# Patient Record
Sex: Female | Born: 1945 | Hispanic: No | Marital: Married
Health system: Southern US, Community
[De-identification: ages and names within clinical notes are randomized; demographics above are authoritative.]

## PROBLEM LIST (undated history)

## (undated) DIAGNOSIS — I10 Essential (primary) hypertension: Secondary | ICD-10-CM

## (undated) DIAGNOSIS — G4730 Sleep apnea, unspecified: Secondary | ICD-10-CM

---

## 2011-11-24 DIAGNOSIS — R262 Difficulty in walking, not elsewhere classified: Secondary | ICD-10-CM | POA: Diagnosis not present

## 2011-11-26 DIAGNOSIS — R262 Difficulty in walking, not elsewhere classified: Secondary | ICD-10-CM | POA: Diagnosis not present

## 2011-11-30 DIAGNOSIS — R262 Difficulty in walking, not elsewhere classified: Secondary | ICD-10-CM | POA: Diagnosis not present

## 2011-12-07 DIAGNOSIS — M25569 Pain in unspecified knee: Secondary | ICD-10-CM | POA: Diagnosis not present

## 2011-12-08 DIAGNOSIS — R262 Difficulty in walking, not elsewhere classified: Secondary | ICD-10-CM | POA: Diagnosis not present

## 2011-12-09 DIAGNOSIS — R262 Difficulty in walking, not elsewhere classified: Secondary | ICD-10-CM | POA: Diagnosis not present

## 2011-12-14 DIAGNOSIS — R262 Difficulty in walking, not elsewhere classified: Secondary | ICD-10-CM | POA: Diagnosis not present

## 2011-12-15 DIAGNOSIS — R262 Difficulty in walking, not elsewhere classified: Secondary | ICD-10-CM | POA: Diagnosis not present

## 2011-12-21 DIAGNOSIS — R262 Difficulty in walking, not elsewhere classified: Secondary | ICD-10-CM | POA: Diagnosis not present

## 2011-12-22 DIAGNOSIS — R262 Difficulty in walking, not elsewhere classified: Secondary | ICD-10-CM | POA: Diagnosis not present

## 2011-12-24 DIAGNOSIS — R262 Difficulty in walking, not elsewhere classified: Secondary | ICD-10-CM | POA: Diagnosis not present

## 2011-12-28 DIAGNOSIS — R262 Difficulty in walking, not elsewhere classified: Secondary | ICD-10-CM | POA: Diagnosis not present

## 2011-12-31 DIAGNOSIS — R262 Difficulty in walking, not elsewhere classified: Secondary | ICD-10-CM | POA: Diagnosis not present

## 2012-01-04 DIAGNOSIS — R262 Difficulty in walking, not elsewhere classified: Secondary | ICD-10-CM | POA: Diagnosis not present

## 2012-01-05 DIAGNOSIS — R262 Difficulty in walking, not elsewhere classified: Secondary | ICD-10-CM | POA: Diagnosis not present

## 2012-01-18 DIAGNOSIS — R262 Difficulty in walking, not elsewhere classified: Secondary | ICD-10-CM | POA: Diagnosis not present

## 2012-08-17 DIAGNOSIS — L259 Unspecified contact dermatitis, unspecified cause: Secondary | ICD-10-CM | POA: Diagnosis not present

## 2012-08-23 DIAGNOSIS — N925 Other specified irregular menstruation: Secondary | ICD-10-CM | POA: Diagnosis not present

## 2012-08-23 DIAGNOSIS — N644 Mastodynia: Secondary | ICD-10-CM | POA: Diagnosis not present

## 2012-08-23 DIAGNOSIS — N926 Irregular menstruation, unspecified: Secondary | ICD-10-CM | POA: Diagnosis not present

## 2012-08-23 DIAGNOSIS — R109 Unspecified abdominal pain: Secondary | ICD-10-CM | POA: Diagnosis not present

## 2012-08-23 DIAGNOSIS — N949 Unspecified condition associated with female genital organs and menstrual cycle: Secondary | ICD-10-CM | POA: Diagnosis not present

## 2012-08-23 DIAGNOSIS — N939 Abnormal uterine and vaginal bleeding, unspecified: Secondary | ICD-10-CM | POA: Diagnosis not present

## 2012-08-31 DIAGNOSIS — Z1239 Encounter for other screening for malignant neoplasm of breast: Secondary | ICD-10-CM | POA: Diagnosis not present

## 2012-08-31 DIAGNOSIS — Z9089 Acquired absence of other organs: Secondary | ICD-10-CM | POA: Diagnosis not present

## 2012-08-31 DIAGNOSIS — N950 Postmenopausal bleeding: Secondary | ICD-10-CM | POA: Diagnosis not present

## 2012-08-31 DIAGNOSIS — R109 Unspecified abdominal pain: Secondary | ICD-10-CM | POA: Diagnosis not present

## 2012-08-31 DIAGNOSIS — N949 Unspecified condition associated with female genital organs and menstrual cycle: Secondary | ICD-10-CM | POA: Diagnosis not present

## 2012-08-31 DIAGNOSIS — Z1231 Encounter for screening mammogram for malignant neoplasm of breast: Secondary | ICD-10-CM | POA: Diagnosis not present

## 2012-10-10 DIAGNOSIS — H04129 Dry eye syndrome of unspecified lacrimal gland: Secondary | ICD-10-CM | POA: Diagnosis not present

## 2013-05-09 ENCOUNTER — Encounter: Payer: Self-pay | Admitting: Internal Medicine

## 2013-09-03 DIAGNOSIS — Z1231 Encounter for screening mammogram for malignant neoplasm of breast: Secondary | ICD-10-CM | POA: Diagnosis not present

## 2013-09-17 DIAGNOSIS — M25519 Pain in unspecified shoulder: Secondary | ICD-10-CM | POA: Diagnosis not present

## 2013-09-28 DIAGNOSIS — Z131 Encounter for screening for diabetes mellitus: Secondary | ICD-10-CM | POA: Diagnosis not present

## 2013-09-28 DIAGNOSIS — R079 Chest pain, unspecified: Secondary | ICD-10-CM | POA: Diagnosis not present

## 2013-09-28 DIAGNOSIS — Z1329 Encounter for screening for other suspected endocrine disorder: Secondary | ICD-10-CM | POA: Diagnosis not present

## 2013-09-28 DIAGNOSIS — E669 Obesity, unspecified: Secondary | ICD-10-CM | POA: Diagnosis not present

## 2013-09-28 DIAGNOSIS — I10 Essential (primary) hypertension: Secondary | ICD-10-CM | POA: Diagnosis not present

## 2013-10-05 DIAGNOSIS — R9431 Abnormal electrocardiogram [ECG] [EKG]: Secondary | ICD-10-CM | POA: Diagnosis not present

## 2013-10-31 DIAGNOSIS — G459 Transient cerebral ischemic attack, unspecified: Secondary | ICD-10-CM | POA: Diagnosis not present

## 2013-10-31 DIAGNOSIS — E119 Type 2 diabetes mellitus without complications: Secondary | ICD-10-CM | POA: Diagnosis not present

## 2013-10-31 DIAGNOSIS — M159 Polyosteoarthritis, unspecified: Secondary | ICD-10-CM | POA: Diagnosis not present

## 2013-10-31 DIAGNOSIS — Z23 Encounter for immunization: Secondary | ICD-10-CM | POA: Diagnosis not present

## 2013-10-31 DIAGNOSIS — Z79899 Other long term (current) drug therapy: Secondary | ICD-10-CM | POA: Diagnosis not present

## 2013-10-31 DIAGNOSIS — R4789 Other speech disturbances: Secondary | ICD-10-CM | POA: Diagnosis not present

## 2013-10-31 DIAGNOSIS — E669 Obesity, unspecified: Secondary | ICD-10-CM | POA: Diagnosis not present

## 2013-10-31 DIAGNOSIS — I10 Essential (primary) hypertension: Secondary | ICD-10-CM | POA: Diagnosis not present

## 2013-10-31 DIAGNOSIS — R51 Headache: Secondary | ICD-10-CM | POA: Diagnosis not present

## 2013-10-31 DIAGNOSIS — Z6833 Body mass index (BMI) 33.0-33.9, adult: Secondary | ICD-10-CM | POA: Diagnosis not present

## 2013-10-31 DIAGNOSIS — Z7982 Long term (current) use of aspirin: Secondary | ICD-10-CM | POA: Diagnosis not present

## 2013-10-31 DIAGNOSIS — R5381 Other malaise: Secondary | ICD-10-CM | POA: Diagnosis not present

## 2013-10-31 DIAGNOSIS — Z780 Asymptomatic menopausal state: Secondary | ICD-10-CM | POA: Diagnosis not present

## 2013-11-01 DIAGNOSIS — F29 Unspecified psychosis not due to a substance or known physiological condition: Secondary | ICD-10-CM | POA: Diagnosis not present

## 2013-11-01 DIAGNOSIS — Z780 Asymptomatic menopausal state: Secondary | ICD-10-CM | POA: Diagnosis not present

## 2013-11-01 DIAGNOSIS — I517 Cardiomegaly: Secondary | ICD-10-CM | POA: Diagnosis not present

## 2013-11-01 DIAGNOSIS — E669 Obesity, unspecified: Secondary | ICD-10-CM | POA: Diagnosis not present

## 2013-11-01 DIAGNOSIS — G459 Transient cerebral ischemic attack, unspecified: Secondary | ICD-10-CM | POA: Diagnosis not present

## 2013-11-01 DIAGNOSIS — I10 Essential (primary) hypertension: Secondary | ICD-10-CM | POA: Diagnosis not present

## 2013-11-01 DIAGNOSIS — I519 Heart disease, unspecified: Secondary | ICD-10-CM | POA: Diagnosis not present

## 2013-11-01 DIAGNOSIS — R4789 Other speech disturbances: Secondary | ICD-10-CM | POA: Diagnosis not present

## 2013-11-02 DIAGNOSIS — I4460 Unspecified fascicular block: Secondary | ICD-10-CM | POA: Diagnosis not present

## 2013-11-02 DIAGNOSIS — I498 Other specified cardiac arrhythmias: Secondary | ICD-10-CM | POA: Diagnosis not present

## 2013-11-02 DIAGNOSIS — R9431 Abnormal electrocardiogram [ECG] [EKG]: Secondary | ICD-10-CM | POA: Diagnosis not present

## 2013-11-09 DIAGNOSIS — G459 Transient cerebral ischemic attack, unspecified: Secondary | ICD-10-CM | POA: Diagnosis not present

## 2013-12-14 DIAGNOSIS — Z79899 Other long term (current) drug therapy: Secondary | ICD-10-CM | POA: Diagnosis not present

## 2013-12-14 DIAGNOSIS — G459 Transient cerebral ischemic attack, unspecified: Secondary | ICD-10-CM | POA: Diagnosis not present

## 2013-12-16 DIAGNOSIS — G459 Transient cerebral ischemic attack, unspecified: Secondary | ICD-10-CM | POA: Diagnosis not present

## 2013-12-18 DIAGNOSIS — G459 Transient cerebral ischemic attack, unspecified: Secondary | ICD-10-CM | POA: Diagnosis not present

## 2014-03-22 DIAGNOSIS — R5381 Other malaise: Secondary | ICD-10-CM | POA: Diagnosis not present

## 2014-03-22 DIAGNOSIS — R5383 Other fatigue: Secondary | ICD-10-CM | POA: Diagnosis not present

## 2014-03-22 DIAGNOSIS — G4730 Sleep apnea, unspecified: Secondary | ICD-10-CM | POA: Diagnosis not present

## 2014-04-30 DIAGNOSIS — H04129 Dry eye syndrome of unspecified lacrimal gland: Secondary | ICD-10-CM | POA: Diagnosis not present

## 2014-08-01 ENCOUNTER — Emergency Department (HOSPITAL_COMMUNITY)
Admission: EM | Admit: 2014-08-01 | Discharge: 2014-08-01 | Disposition: A | Discharge status: Home / Self Care | Payer: Medicare Other | Attending: Emergency Medicine | Admitting: Emergency Medicine

## 2014-08-01 ENCOUNTER — Emergency Department (HOSPITAL_COMMUNITY): Payer: Medicare Other

## 2014-08-01 DIAGNOSIS — M25569 Pain in unspecified knee: Secondary | ICD-10-CM | POA: Insufficient documentation

## 2014-08-01 DIAGNOSIS — M25562 Pain in left knee: Secondary | ICD-10-CM

## 2014-08-01 DIAGNOSIS — M1712 Unilateral primary osteoarthritis, left knee: Secondary | ICD-10-CM

## 2014-08-01 DIAGNOSIS — Z9889 Other specified postprocedural states: Secondary | ICD-10-CM | POA: Diagnosis not present

## 2014-08-01 DIAGNOSIS — IMO0002 Reserved for concepts with insufficient information to code with codable children: Principal | ICD-10-CM

## 2014-08-01 DIAGNOSIS — M1710 Unilateral primary osteoarthritis, unspecified knee: Secondary | ICD-10-CM | POA: Diagnosis not present

## 2014-08-01 MED ORDER — HYDROCODONE-ACETAMINOPHEN 5-325 MG PO TABS: 1.0000 | ORAL_TABLET | ORAL | Status: AC | PRN

## 2014-08-01 MED ORDER — HYDROCODONE-ACETAMINOPHEN 5-325 MG PO TABS
2.0000 | ORAL_TABLET | Freq: Once | ORAL | Status: AC
  Administered 2014-08-01: 2 via ORAL
  Filled 2014-08-01: qty 2

## 2014-08-01 NOTE — ED Provider Notes (Signed)
CSN: [id number]     Arrival date & time 08/01/14  1141 History  This chart was scribed for Enid Skeens, MD by Littie Deeds, ED Scribe. This patient was seen in room APA19/APA19 and the patient's care was started at 12:40 PM.     Chief Complaint  Patient presents with  . Knee Pain      Patient is a 68 y.o. female presenting with knee pain. The history is provided by the patient. No language interpreter was used.  Knee Pain Location:  Knee Injury: no   Knee location:  L knee Pain details:    Radiates to:  Does not radiate   Severity:  Moderate   Duration:  2 weeks   Timing:  Constant   Progression:  Worsening Dislocation: no   Prior injury to area:  No Relieved by:  Nothing Ineffective treatments:  NSAIDs Associated symptoms: decreased ROM   Associated symptoms: no fever    HPI Comments: Kristin Kelly is a 68 y.o. female with Hx of Osteoarthritis and PSHx of right knee replacement who presents to the Emergency Department complaining of constant, gradually worsening left knee pain that began 2 weeks ago. She has taken  Advil every 6 hours but without relief to symptoms. She also has pain in her right hand. Patient recently went to [place] and was sitting in the car for 5 hours straight. She denies fever, chills, rash, CP, abdominal pain, HA, and recent surgery. She also denies Hx of CA, kidney problems, PUD and blood clots. Her knee surgery was done by Dr. Christell Constant in [place], [place].   No past medical history on file. No past surgical history on file. No family history on file. History  Substance Use Topics  . Smoking status: Not on file  . Smokeless tobacco: Not on file  . Alcohol Use: Not on file   OB History   No data available     Review of Systems  Constitutional: Negative for fever and chills.  Cardiovascular: Negative for chest pain.  Gastrointestinal: Negative for abdominal pain.  Musculoskeletal: Positive for arthralgias (left knee).  Skin: Negative for  rash.  Neurological: Negative for headaches.  All other systems reviewed and are negative.     Allergies  Review of patient's allergies indicates not on file.  Home Medications   Prior to Admission medications   Not on File   BP 141/75  Pulse 67  Temp(Src) 97.9 F (36.6 C) (Oral)  Resp 20  SpO2 98% Physical Exam  Nursing note and vitals reviewed. Constitutional: She is oriented to person, place, and time. She appears well-developed and well-nourished. No distress.  HENT:  Head: Normocephalic and atraumatic.  Mouth/Throat: Oropharynx is clear and moist. No oropharyngeal exudate.  Eyes: Pupils are equal, round, and reactive to light.  Neck: Neck supple.  Cardiovascular: Normal rate and intact distal pulses.   2+ pulses distally  Pulmonary/Chest: Effort normal.  Musculoskeletal: She exhibits tenderness. She exhibits no edema.  Tenderness in medial joint line and medial aspect of knee, ligaments appear strong and intact, pain with drawer test, no rash or warmth of joint, no swelling, full rom  Neurological: She is alert and oriented to person, place, and time. No cranial nerve deficit.  Sensation intact distally  Skin: Skin is warm and dry. No rash noted.  Psychiatric: She has a normal mood and affect. Her behavior is normal.    ED Course  Procedures  DIAGNOSTIC STUDIES: Oxygen Saturation is 98% on RA, nml by  my interpretation.    COORDINATION OF CARE: 12:49 PM-Discussed treatment plan which includes imaging and pain medications with pt at bedside and pt agreed to plan.     Labs Review Labs Reviewed - No data to display  Imaging Review US Venous Img Lower Unilateral Left  08/01/2014   CLINICAL DATA:  Left knee pain.  EXAM: Left LOWER EXTREMITY VENOUS DOPPLER ULTRASOUND  TECHNIQUE: Gray-scale sonography with graded compression, as well as color Doppler and duplex ultrasound were performed to evaluate the lower extremity deep venous systems from the level of the  common femoral vein and including the common femoral, femoral, profunda femoral, popliteal and calf veins including the posterior tibial, peroneal and gastrocnemius veins when visible. The superficial great saphenous vein was also interrogated. Spectral Doppler was utilized to evaluate flow at rest and with distal augmentation maneuvers in the common femoral, femoral and popliteal veins.  COMPARISON:  Radiographs dated 08/01/2014  FINDINGS: Common Femoral Vein: No evidence of thrombus. Normal compressibility, respiratory phasicity and response to augmentation.  Saphenofemoral Junction: No evidence of thrombus. Normal compressibility and flow on color Doppler imaging.  Profunda Femoral Vein: No evidence of thrombus. Normal compressibility and flow on color Doppler imaging.  Femoral Vein: No evidence of thrombus. Normal compressibility, respiratory phasicity and response to augmentation.  Popliteal Vein: No evidence of thrombus. Normal compressibility, respiratory phasicity and response to augmentation.  Calf Veins: No evidence of thrombus. Normal compressibility and flow on color Doppler imaging.  Superficial Great Saphenous Vein: No evidence of thrombus. Normal compressibility and flow on color Doppler imaging.  Venous Reflux:  None.  Other Findings:  None.  IMPRESSION: No evidence of deep venous thrombosis.   Electronically Signed   By: Geanie Cooley M.D.   On: 08/01/2014 13:55   Dg Knee Complete 4 Views Left  08/01/2014   CLINICAL DATA:  Left knee pain and swelling.  EXAM: LEFT KNEE - COMPLETE 4+ VIEW  COMPARISON:  None.  FINDINGS: There is no fracture or dislocation or joint effusion. Moderate degenerative changes of the patellofemoral compartment. Osteophytes are present adjacent to 1 of the anterior tibial spines. Small marginal osteophytes in the medial and lateral compartments.  IMPRESSION: No acute abnormality. Arthritic changes, most prominent in the patellofemoral compartment.   Electronically Signed    By: Geanie Cooley M.D.   On: 08/01/2014 13:42     EKG Interpretation None      MDM   Final diagnoses:  Left knee pain  Arthritis of left knee    I personally performed the services described in this documentation, which was scribed in my presence. The recorded information has been reviewed and is accurate.;  Knee pain, mechanical, no signs of septic joint, no new injury. Xray no acute findings, arthritis, reviewed. [place] no DVT, pain improved in ED>  Results and differential diagnosis were discussed with the patient/parent/guardian. Close follow up outpatient was discussed, comfortable with the plan.   Medications  HYDROcodone-acetaminophen (NORCO/VICODIN) 5-325 MG per tablet 2 tablet (2 tablets Oral Given 08/01/14 1258)    Filed Vitals:   08/01/14 1213 08/01/14 1214 08/01/14 1407  BP: 141/50 141/75 136/67  Pulse: 67  56  Temp: 97.9 F (36.6 C)    TempSrc: Oral    Resp: 20  18  SpO2: 98%  98%    Final diagnoses:  Left knee pain  Arthritis of left knee      Enid Skeens, MD 08/03/14 1118

## 2014-08-01 NOTE — Discharge Instructions (Signed)
Continue taking your the counter medicines and use ice as needed. For severe pain take norco or vicodin however realize they have the potential for addiction and it can make you sleepy and has tylenol in it.  No operating machinery while taking.  If you were given medicines take as directed.  If you are on coumadin or contraceptives realize their levels and effectiveness is altered by many different medicines.  If you have any reaction (rash, tongues swelling, other) to the medicines stop taking and see a physician.   Please follow up as directed and return to the ER or see a physician for new or worsening symptoms.  Thank you. Filed Vitals:   08/01/14 1213 08/01/14 1214  BP: 141/50 141/75  Pulse: 67   Temp: 97.9 F (36.6 C)   TempSrc: Oral   Resp: 20   SpO2: 98%

## 2014-08-01 NOTE — ED Notes (Signed)
Pt complain of pain in left knee that started two weeks ago. Pt states she rode in the car for five hours to [place]. States the pain is worse now

## 2014-08-02 DIAGNOSIS — M1710 Unilateral primary osteoarthritis, unspecified knee: Secondary | ICD-10-CM | POA: Diagnosis not present

## 2014-09-13 DIAGNOSIS — M25762 Osteophyte, left knee: Secondary | ICD-10-CM | POA: Diagnosis not present

## 2014-09-18 DIAGNOSIS — Z131 Encounter for screening for diabetes mellitus: Secondary | ICD-10-CM | POA: Diagnosis not present

## 2014-09-18 DIAGNOSIS — I10 Essential (primary) hypertension: Secondary | ICD-10-CM | POA: Diagnosis not present

## 2014-09-18 DIAGNOSIS — Z79899 Other long term (current) drug therapy: Secondary | ICD-10-CM | POA: Diagnosis not present

## 2014-10-15 DIAGNOSIS — M1712 Unilateral primary osteoarthritis, left knee: Secondary | ICD-10-CM | POA: Diagnosis not present

## 2014-10-15 DIAGNOSIS — Z96651 Presence of right artificial knee joint: Secondary | ICD-10-CM | POA: Diagnosis not present

## 2015-02-04 DIAGNOSIS — H2513 Age-related nuclear cataract, bilateral: Secondary | ICD-10-CM | POA: Diagnosis not present

## 2015-03-14 DIAGNOSIS — M1712 Unilateral primary osteoarthritis, left knee: Secondary | ICD-10-CM | POA: Diagnosis not present

## 2015-03-14 DIAGNOSIS — I10 Essential (primary) hypertension: Secondary | ICD-10-CM | POA: Diagnosis not present

## 2015-03-14 DIAGNOSIS — M25762 Osteophyte, left knee: Secondary | ICD-10-CM | POA: Diagnosis not present

## 2015-03-14 DIAGNOSIS — G4733 Obstructive sleep apnea (adult) (pediatric): Secondary | ICD-10-CM | POA: Diagnosis not present

## 2015-03-17 DIAGNOSIS — M1712 Unilateral primary osteoarthritis, left knee: Secondary | ICD-10-CM | POA: Diagnosis not present

## 2015-03-17 DIAGNOSIS — Z96651 Presence of right artificial knee joint: Secondary | ICD-10-CM | POA: Diagnosis not present

## 2015-03-19 DIAGNOSIS — M25562 Pain in left knee: Secondary | ICD-10-CM | POA: Diagnosis not present

## 2015-03-21 DIAGNOSIS — M25562 Pain in left knee: Secondary | ICD-10-CM | POA: Diagnosis not present

## 2015-03-26 DIAGNOSIS — M25562 Pain in left knee: Secondary | ICD-10-CM | POA: Diagnosis not present

## 2015-03-28 DIAGNOSIS — M25562 Pain in left knee: Secondary | ICD-10-CM | POA: Diagnosis not present

## 2015-03-31 DIAGNOSIS — M25562 Pain in left knee: Secondary | ICD-10-CM | POA: Diagnosis not present

## 2015-04-02 DIAGNOSIS — M25562 Pain in left knee: Secondary | ICD-10-CM | POA: Diagnosis not present

## 2015-04-18 DIAGNOSIS — M25562 Pain in left knee: Secondary | ICD-10-CM | POA: Diagnosis not present

## 2015-05-02 DIAGNOSIS — I10 Essential (primary) hypertension: Secondary | ICD-10-CM | POA: Diagnosis not present

## 2015-05-02 DIAGNOSIS — M25762 Osteophyte, left knee: Secondary | ICD-10-CM | POA: Diagnosis not present

## 2015-05-02 DIAGNOSIS — G4733 Obstructive sleep apnea (adult) (pediatric): Secondary | ICD-10-CM | POA: Diagnosis not present

## 2015-05-02 DIAGNOSIS — Z131 Encounter for screening for diabetes mellitus: Secondary | ICD-10-CM | POA: Diagnosis not present

## 2015-05-02 DIAGNOSIS — M1712 Unilateral primary osteoarthritis, left knee: Secondary | ICD-10-CM | POA: Diagnosis not present

## 2015-05-23 DIAGNOSIS — Z1231 Encounter for screening mammogram for malignant neoplasm of breast: Secondary | ICD-10-CM | POA: Diagnosis not present

## 2015-06-02 DIAGNOSIS — Z96651 Presence of right artificial knee joint: Secondary | ICD-10-CM | POA: Diagnosis not present

## 2015-06-02 DIAGNOSIS — M25561 Pain in right knee: Secondary | ICD-10-CM | POA: Diagnosis not present

## 2015-06-02 DIAGNOSIS — R262 Difficulty in walking, not elsewhere classified: Secondary | ICD-10-CM | POA: Diagnosis not present

## 2015-06-04 DIAGNOSIS — R262 Difficulty in walking, not elsewhere classified: Secondary | ICD-10-CM | POA: Diagnosis not present

## 2015-06-04 DIAGNOSIS — M25561 Pain in right knee: Secondary | ICD-10-CM | POA: Diagnosis not present

## 2015-06-04 DIAGNOSIS — Z96651 Presence of right artificial knee joint: Secondary | ICD-10-CM | POA: Diagnosis not present

## 2015-06-11 DIAGNOSIS — R262 Difficulty in walking, not elsewhere classified: Secondary | ICD-10-CM | POA: Diagnosis not present

## 2015-06-11 DIAGNOSIS — Z96651 Presence of right artificial knee joint: Secondary | ICD-10-CM | POA: Diagnosis not present

## 2015-06-11 DIAGNOSIS — M25561 Pain in right knee: Secondary | ICD-10-CM | POA: Diagnosis not present

## 2015-06-12 DIAGNOSIS — Z96651 Presence of right artificial knee joint: Secondary | ICD-10-CM | POA: Diagnosis not present

## 2015-06-12 DIAGNOSIS — M25561 Pain in right knee: Secondary | ICD-10-CM | POA: Diagnosis not present

## 2015-06-12 DIAGNOSIS — R262 Difficulty in walking, not elsewhere classified: Secondary | ICD-10-CM | POA: Diagnosis not present

## 2015-06-17 DIAGNOSIS — Z96651 Presence of right artificial knee joint: Secondary | ICD-10-CM | POA: Diagnosis not present

## 2015-06-17 DIAGNOSIS — M25561 Pain in right knee: Secondary | ICD-10-CM | POA: Diagnosis not present

## 2015-06-17 DIAGNOSIS — R262 Difficulty in walking, not elsewhere classified: Secondary | ICD-10-CM | POA: Diagnosis not present

## 2015-06-26 DIAGNOSIS — Z96651 Presence of right artificial knee joint: Secondary | ICD-10-CM | POA: Diagnosis not present

## 2015-06-26 DIAGNOSIS — R262 Difficulty in walking, not elsewhere classified: Secondary | ICD-10-CM | POA: Diagnosis not present

## 2015-06-26 DIAGNOSIS — M25561 Pain in right knee: Secondary | ICD-10-CM | POA: Diagnosis not present

## 2015-07-01 DIAGNOSIS — M25561 Pain in right knee: Secondary | ICD-10-CM | POA: Diagnosis not present

## 2015-07-01 DIAGNOSIS — Z96651 Presence of right artificial knee joint: Secondary | ICD-10-CM | POA: Diagnosis not present

## 2015-07-01 DIAGNOSIS — R262 Difficulty in walking, not elsewhere classified: Secondary | ICD-10-CM | POA: Diagnosis not present

## 2015-07-03 DIAGNOSIS — M25561 Pain in right knee: Secondary | ICD-10-CM | POA: Diagnosis not present

## 2015-07-03 DIAGNOSIS — Z96651 Presence of right artificial knee joint: Secondary | ICD-10-CM | POA: Diagnosis not present

## 2015-07-03 DIAGNOSIS — R262 Difficulty in walking, not elsewhere classified: Secondary | ICD-10-CM | POA: Diagnosis not present

## 2015-07-08 DIAGNOSIS — Z96651 Presence of right artificial knee joint: Secondary | ICD-10-CM | POA: Diagnosis not present

## 2015-07-08 DIAGNOSIS — R262 Difficulty in walking, not elsewhere classified: Secondary | ICD-10-CM | POA: Diagnosis not present

## 2015-07-08 DIAGNOSIS — M25561 Pain in right knee: Secondary | ICD-10-CM | POA: Diagnosis not present

## 2015-07-17 DIAGNOSIS — R262 Difficulty in walking, not elsewhere classified: Secondary | ICD-10-CM | POA: Diagnosis not present

## 2015-07-17 DIAGNOSIS — M25561 Pain in right knee: Secondary | ICD-10-CM | POA: Diagnosis not present

## 2015-07-17 DIAGNOSIS — Z96651 Presence of right artificial knee joint: Secondary | ICD-10-CM | POA: Diagnosis not present

## 2015-08-06 DIAGNOSIS — Z23 Encounter for immunization: Secondary | ICD-10-CM | POA: Diagnosis not present

## 2015-08-28 DIAGNOSIS — Z131 Encounter for screening for diabetes mellitus: Secondary | ICD-10-CM | POA: Diagnosis not present

## 2015-08-28 DIAGNOSIS — E1121 Type 2 diabetes mellitus with diabetic nephropathy: Secondary | ICD-10-CM | POA: Diagnosis not present

## 2015-08-28 DIAGNOSIS — M172 Bilateral post-traumatic osteoarthritis of knee: Secondary | ICD-10-CM | POA: Diagnosis not present

## 2015-08-28 DIAGNOSIS — I10 Essential (primary) hypertension: Secondary | ICD-10-CM | POA: Diagnosis not present

## 2016-01-27 DIAGNOSIS — K2960 Other gastritis without bleeding: Secondary | ICD-10-CM | POA: Diagnosis not present

## 2016-01-27 DIAGNOSIS — I10 Essential (primary) hypertension: Secondary | ICD-10-CM | POA: Diagnosis not present

## 2016-02-17 DIAGNOSIS — M1712 Unilateral primary osteoarthritis, left knee: Secondary | ICD-10-CM | POA: Diagnosis not present

## 2016-02-17 DIAGNOSIS — Z96651 Presence of right artificial knee joint: Secondary | ICD-10-CM | POA: Diagnosis not present

## 2016-02-24 DIAGNOSIS — H524 Presbyopia: Secondary | ICD-10-CM | POA: Diagnosis not present

## 2016-02-24 DIAGNOSIS — H2513 Age-related nuclear cataract, bilateral: Secondary | ICD-10-CM | POA: Diagnosis not present

## 2016-05-14 IMAGING — CR DG KNEE COMPLETE 4+V*L*
4 series · 4 of 4 positions shown · non-contrast
Comparison: None.

CLINICAL DATA: Left knee pain and swelling.

EXAM:
LEFT KNEE - COMPLETE 4+ VIEW

[view not recorded (1 of 4)]
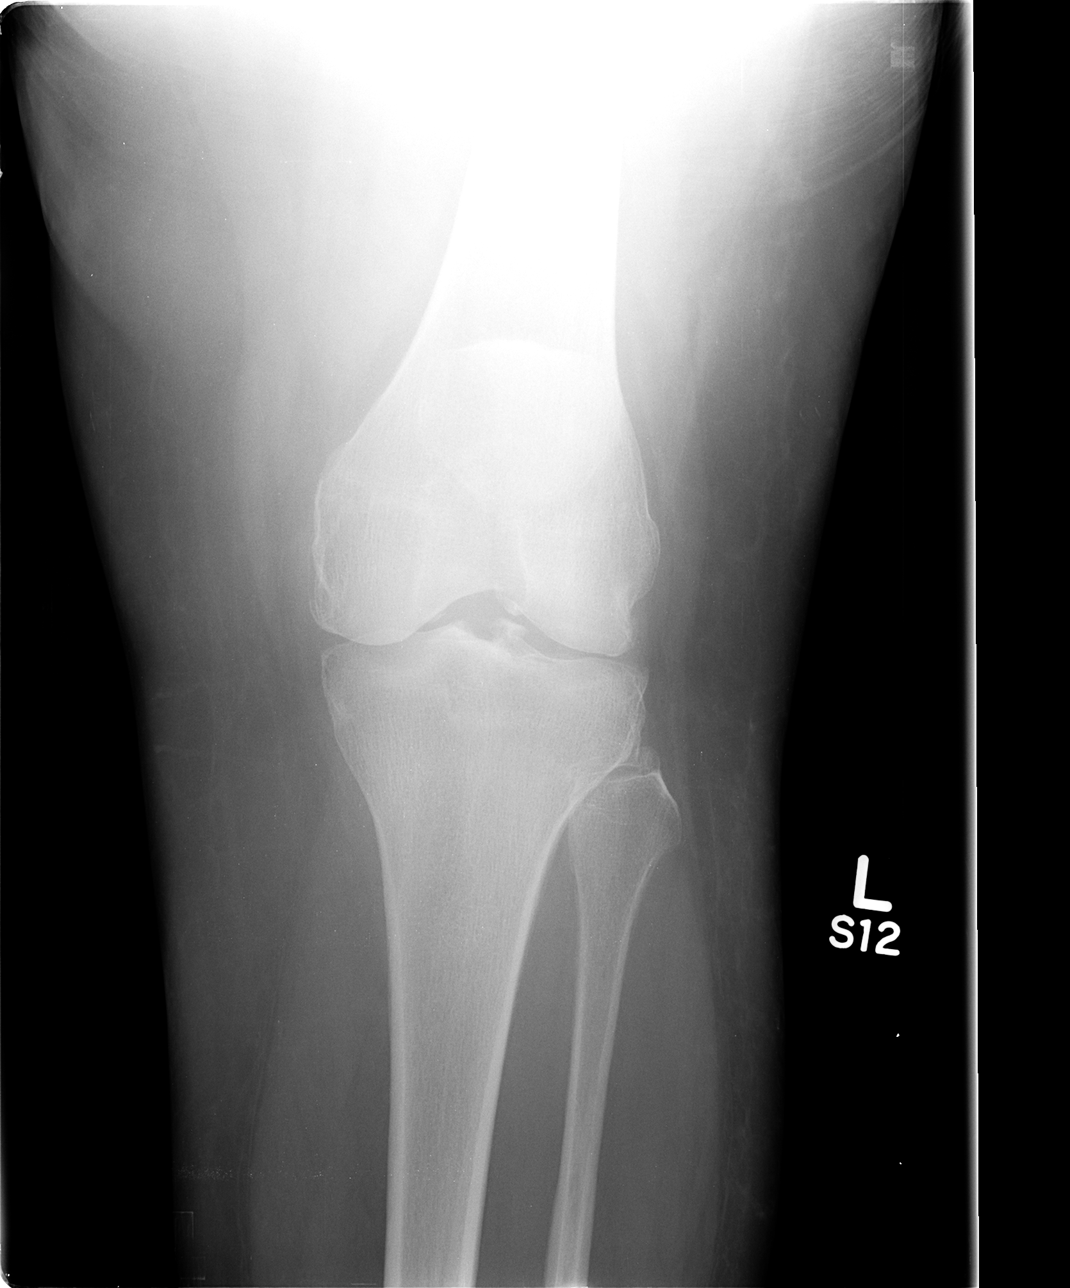

[view not recorded (2 of 4)]
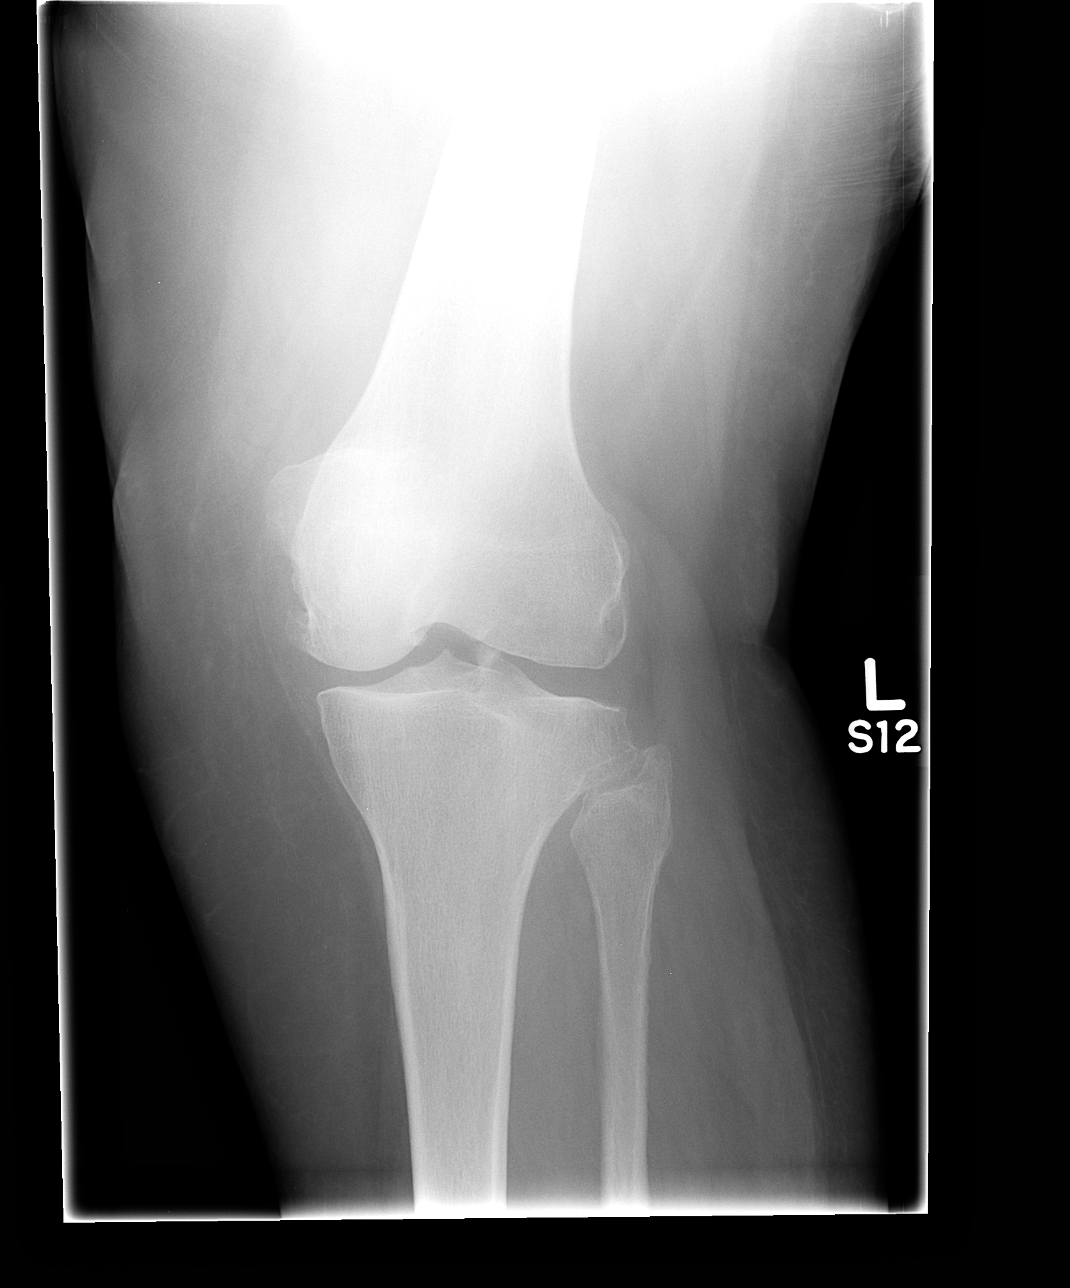

[view not recorded (3 of 4)]
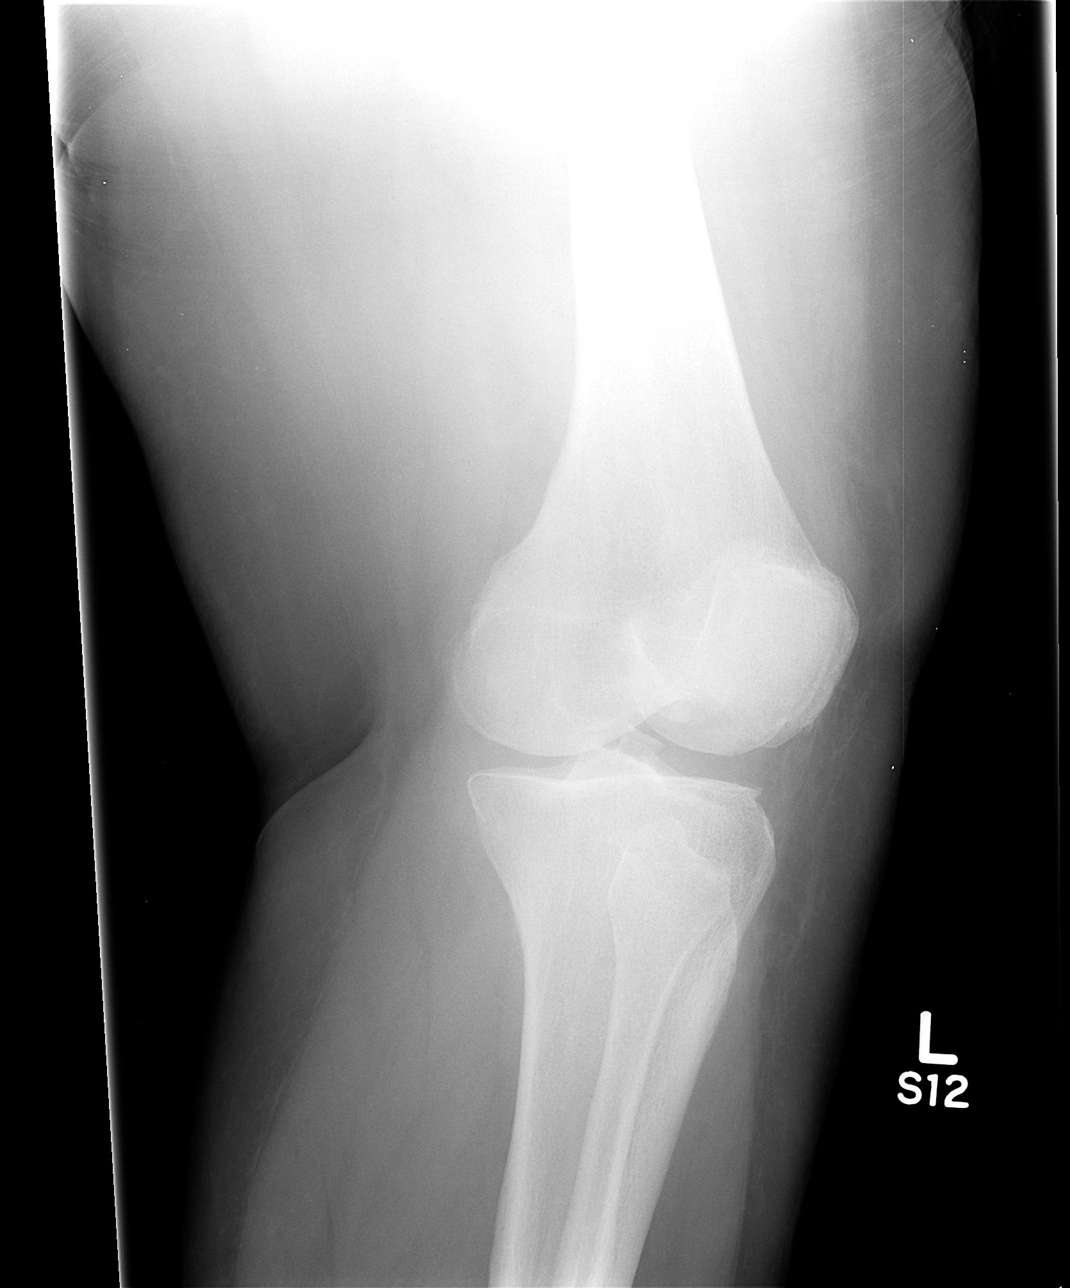

[view not recorded (4 of 4)]
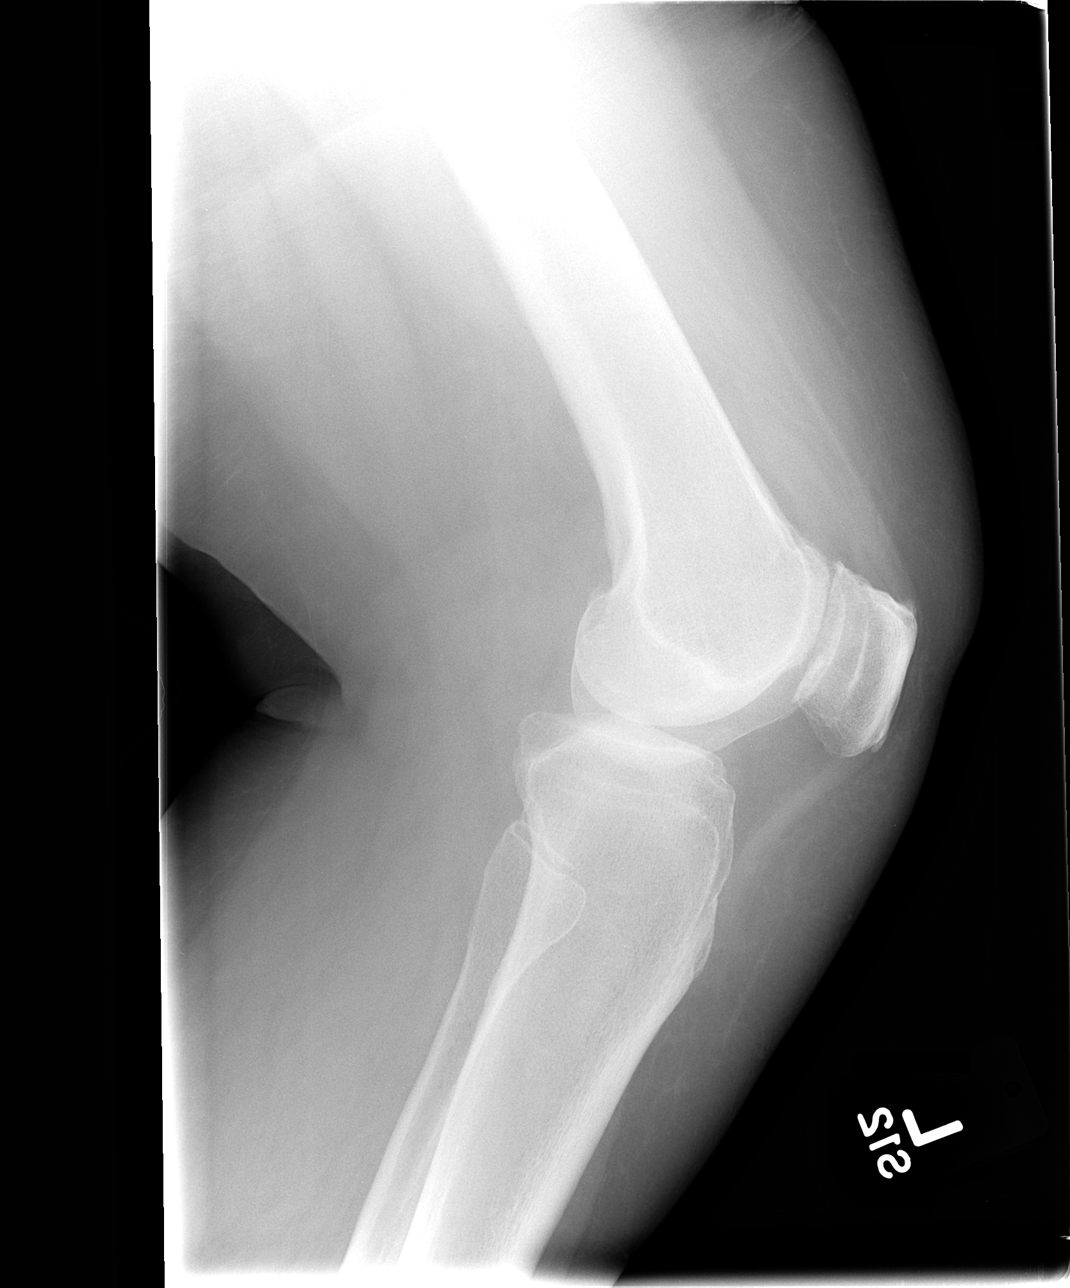

[4 of 4 positions shown; findings below may reference images not displayed]

FINDINGS: There is no fracture or dislocation or joint effusion. Moderate
degenerative changes of the patellofemoral compartment. Osteophytes
are present adjacent to 1 of the anterior tibial spines. Small
marginal osteophytes in the medial and lateral compartments.
IMPRESSION: No acute abnormality. Arthritic changes, most prominent in the
patellofemoral compartment.

## 2016-05-14 IMAGING — US US EXTREM LOW VENOUS*L*
1 series · 13 of 24 positions shown · non-contrast
Comparison: Radiographs dated 08/01/2014

CLINICAL DATA: Left knee pain.



[Series 1: us extrem low venous*left* · 0.08mm/px · 13 of 32 slices shown]
[im 1/32]
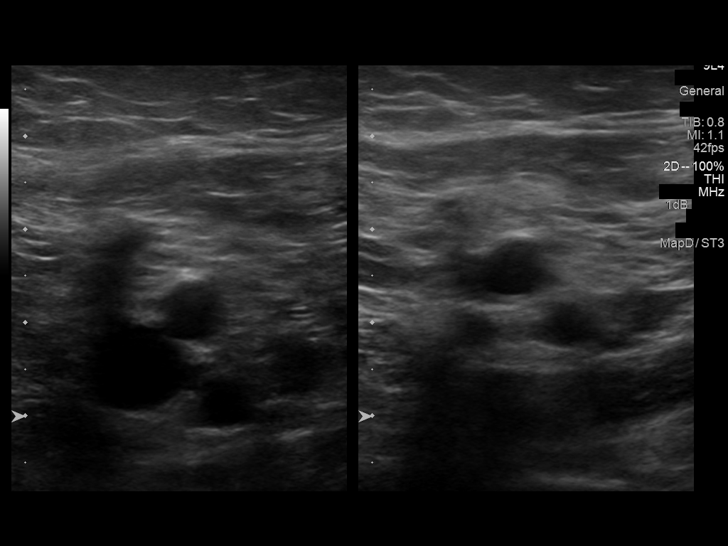
[im 3/32]
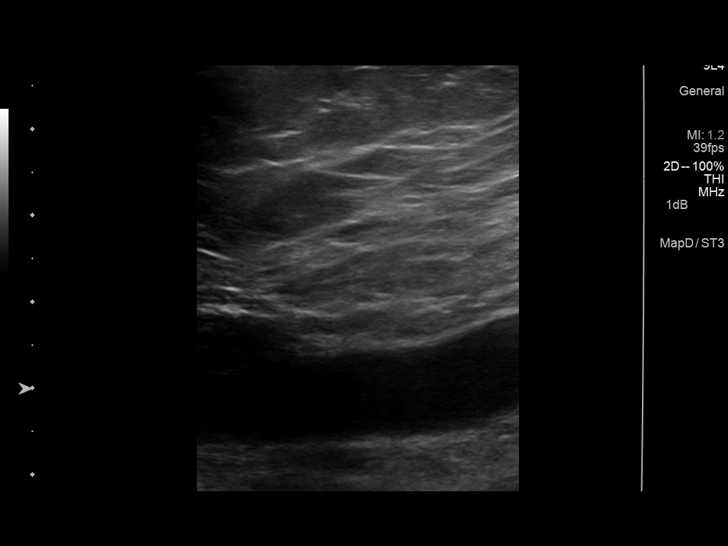
[im 6/32]
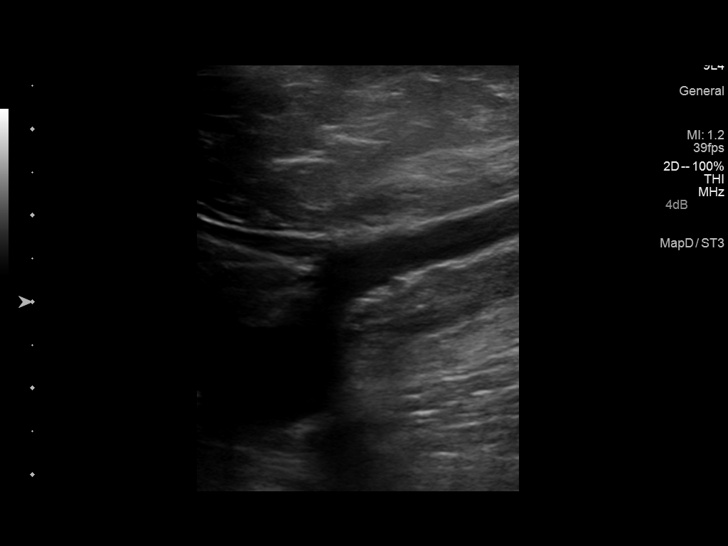
[im 9/32]
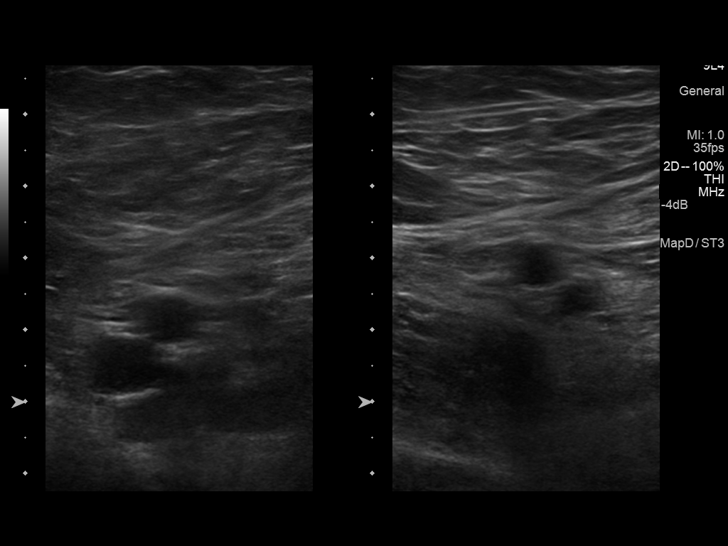
[im 11/32]
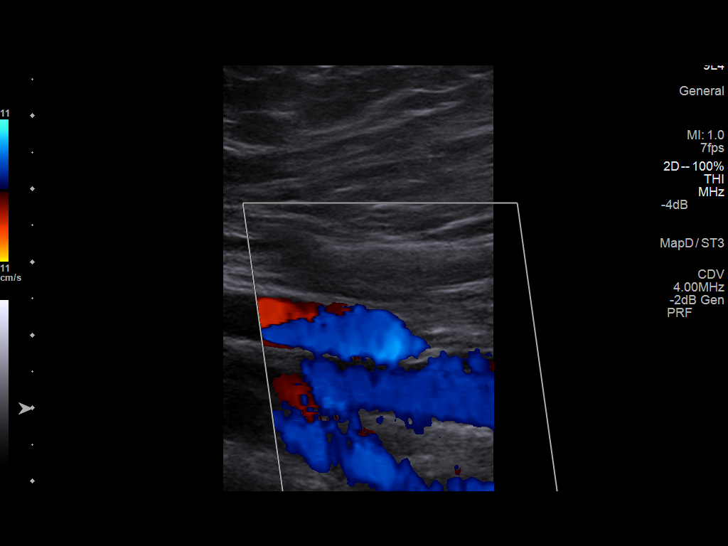
[im 14/32]
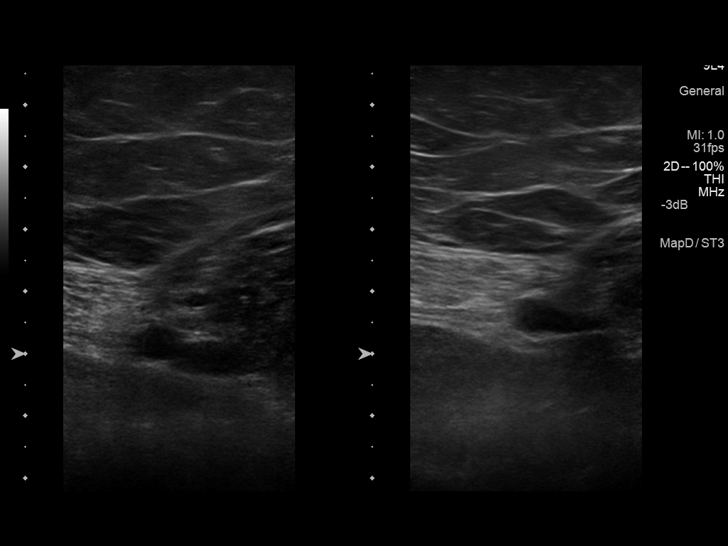
[im 17/32]
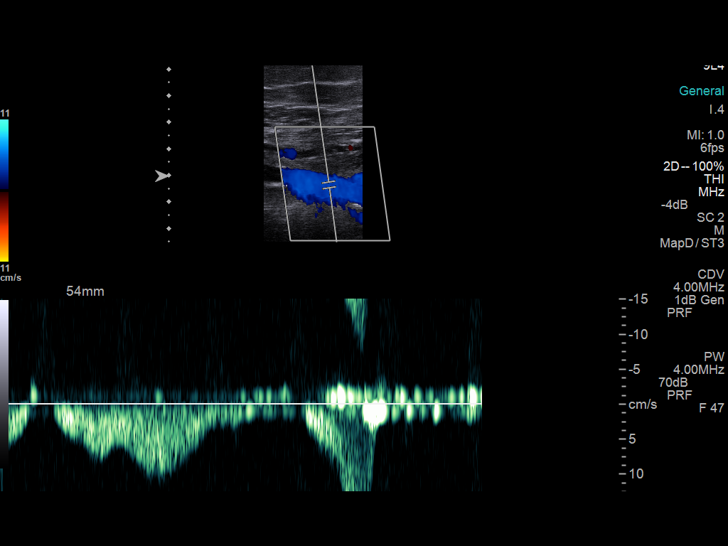
[im 18/32]
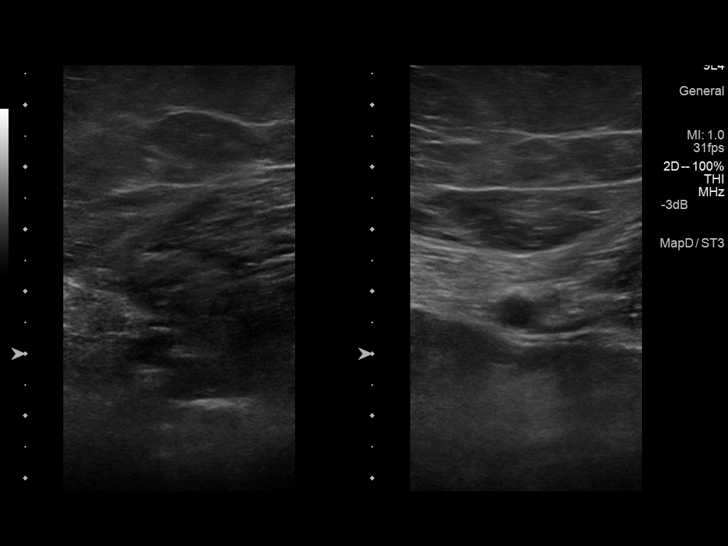
[im 21/32]
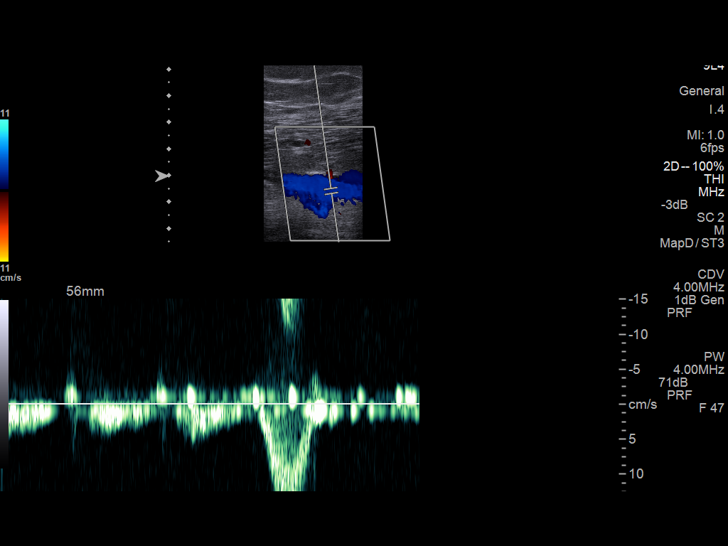
[im 23/32]
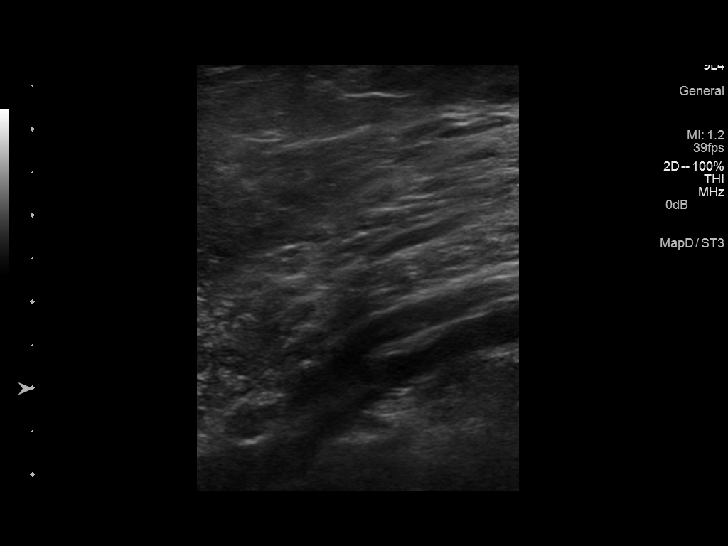
[im 26/32]
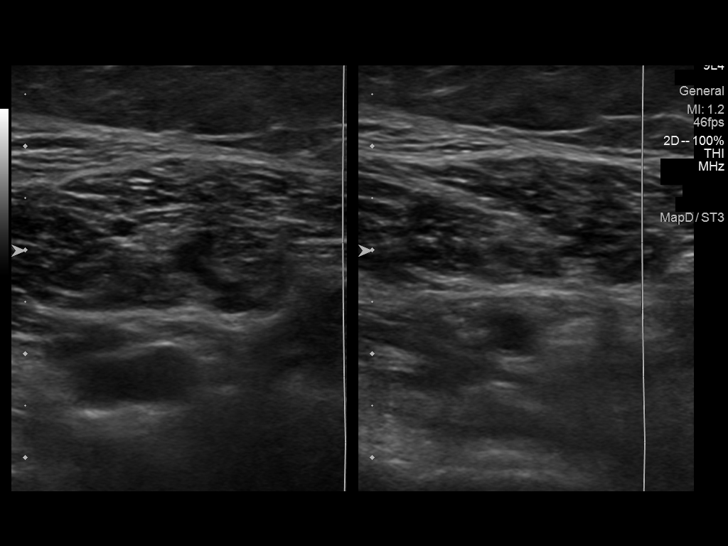
[im 29/32]
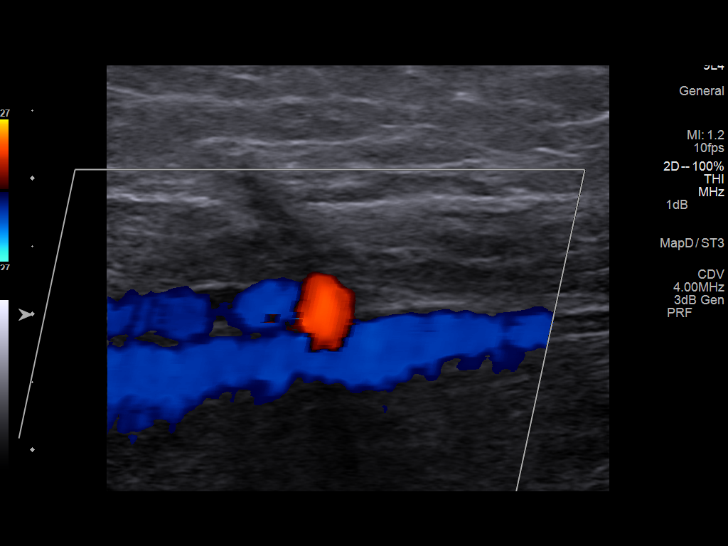
[im 32/32]
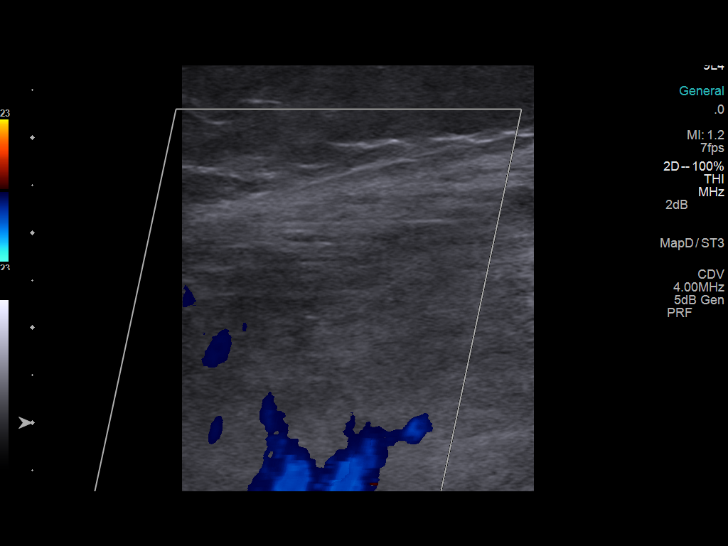

[13 of 24 positions shown; findings below may reference images not displayed]

FINDINGS: Common Femoral Vein: No evidence of thrombus. Normal
compressibility, respiratory phasicity and response to augmentation.

Saphenofemoral Junction: No evidence of thrombus. Normal
compressibility and flow on color Doppler imaging.

Profunda Femoral Vein: No evidence of thrombus. Normal
compressibility and flow on color Doppler imaging.

Femoral Vein: No evidence of thrombus. Normal compressibility,
respiratory phasicity and response to augmentation.

Popliteal Vein: No evidence of thrombus. Normal compressibility,
respiratory phasicity and response to augmentation.

Calf Veins: No evidence of thrombus. Normal compressibility and flow
on color Doppler imaging.

Superficial Great Saphenous Vein: No evidence of thrombus. Normal
compressibility and flow on color Doppler imaging.

Venous Reflux:  None.

Other Findings:  None.
IMPRESSION: No evidence of deep venous thrombosis.

## 2016-05-25 DIAGNOSIS — Z1231 Encounter for screening mammogram for malignant neoplasm of breast: Secondary | ICD-10-CM | POA: Diagnosis not present

## 2016-06-01 DIAGNOSIS — M1712 Unilateral primary osteoarthritis, left knee: Secondary | ICD-10-CM | POA: Diagnosis not present

## 2016-06-01 DIAGNOSIS — I10 Essential (primary) hypertension: Secondary | ICD-10-CM | POA: Diagnosis not present

## 2016-06-04 DIAGNOSIS — I10 Essential (primary) hypertension: Secondary | ICD-10-CM | POA: Diagnosis not present

## 2016-06-04 DIAGNOSIS — Z131 Encounter for screening for diabetes mellitus: Secondary | ICD-10-CM | POA: Diagnosis not present

## 2016-09-02 DIAGNOSIS — E782 Mixed hyperlipidemia: Secondary | ICD-10-CM | POA: Diagnosis not present

## 2016-09-02 DIAGNOSIS — Z23 Encounter for immunization: Secondary | ICD-10-CM | POA: Diagnosis not present

## 2016-09-02 DIAGNOSIS — M150 Primary generalized (osteo)arthritis: Secondary | ICD-10-CM | POA: Diagnosis not present

## 2016-09-02 DIAGNOSIS — H8110 Benign paroxysmal vertigo, unspecified ear: Secondary | ICD-10-CM | POA: Diagnosis not present

## 2016-09-02 DIAGNOSIS — Z96651 Presence of right artificial knee joint: Secondary | ICD-10-CM | POA: Diagnosis not present

## 2016-09-02 DIAGNOSIS — F321 Major depressive disorder, single episode, moderate: Secondary | ICD-10-CM | POA: Diagnosis not present

## 2016-09-02 DIAGNOSIS — I10 Essential (primary) hypertension: Secondary | ICD-10-CM | POA: Diagnosis not present

## 2016-09-23 DIAGNOSIS — I10 Essential (primary) hypertension: Secondary | ICD-10-CM | POA: Diagnosis not present

## 2016-09-23 DIAGNOSIS — E782 Mixed hyperlipidemia: Secondary | ICD-10-CM | POA: Diagnosis not present

## 2016-09-23 DIAGNOSIS — F321 Major depressive disorder, single episode, moderate: Secondary | ICD-10-CM | POA: Diagnosis not present

## 2016-09-23 DIAGNOSIS — M150 Primary generalized (osteo)arthritis: Secondary | ICD-10-CM | POA: Diagnosis not present

## 2016-09-23 DIAGNOSIS — Z1211 Encounter for screening for malignant neoplasm of colon: Secondary | ICD-10-CM | POA: Diagnosis not present

## 2016-10-21 DIAGNOSIS — Z1211 Encounter for screening for malignant neoplasm of colon: Secondary | ICD-10-CM | POA: Diagnosis not present

## 2016-12-24 DIAGNOSIS — I10 Essential (primary) hypertension: Secondary | ICD-10-CM | POA: Diagnosis not present

## 2016-12-24 DIAGNOSIS — Z1159 Encounter for screening for other viral diseases: Secondary | ICD-10-CM | POA: Diagnosis not present

## 2016-12-24 DIAGNOSIS — Z8673 Personal history of transient ischemic attack (TIA), and cerebral infarction without residual deficits: Secondary | ICD-10-CM | POA: Diagnosis not present

## 2016-12-24 DIAGNOSIS — F331 Major depressive disorder, recurrent, moderate: Secondary | ICD-10-CM | POA: Diagnosis not present

## 2016-12-24 DIAGNOSIS — E782 Mixed hyperlipidemia: Secondary | ICD-10-CM | POA: Diagnosis not present

## 2017-01-27 DIAGNOSIS — M1712 Unilateral primary osteoarthritis, left knee: Secondary | ICD-10-CM | POA: Diagnosis not present

## 2017-01-27 DIAGNOSIS — H671 Otitis media in diseases classified elsewhere, right ear: Secondary | ICD-10-CM | POA: Diagnosis not present

## 2017-01-27 DIAGNOSIS — I10 Essential (primary) hypertension: Secondary | ICD-10-CM | POA: Diagnosis not present

## 2017-03-01 DIAGNOSIS — H2513 Age-related nuclear cataract, bilateral: Secondary | ICD-10-CM | POA: Diagnosis not present

## 2017-03-03 DIAGNOSIS — Z0000 Encounter for general adult medical examination without abnormal findings: Secondary | ICD-10-CM | POA: Diagnosis not present

## 2017-03-03 DIAGNOSIS — I10 Essential (primary) hypertension: Secondary | ICD-10-CM | POA: Diagnosis not present

## 2017-04-04 DIAGNOSIS — I10 Essential (primary) hypertension: Secondary | ICD-10-CM | POA: Diagnosis not present

## 2017-04-04 DIAGNOSIS — I208 Other forms of angina pectoris: Secondary | ICD-10-CM | POA: Diagnosis not present

## 2017-04-04 DIAGNOSIS — M1712 Unilateral primary osteoarthritis, left knee: Secondary | ICD-10-CM | POA: Diagnosis not present

## 2017-04-04 DIAGNOSIS — K219 Gastro-esophageal reflux disease without esophagitis: Secondary | ICD-10-CM | POA: Diagnosis not present

## 2017-04-13 DIAGNOSIS — T50995A Adverse effect of other drugs, medicaments and biological substances, initial encounter: Secondary | ICD-10-CM | POA: Diagnosis not present

## 2017-04-13 DIAGNOSIS — I10 Essential (primary) hypertension: Secondary | ICD-10-CM | POA: Diagnosis not present

## 2017-05-05 DIAGNOSIS — I208 Other forms of angina pectoris: Secondary | ICD-10-CM | POA: Diagnosis not present

## 2017-07-20 DIAGNOSIS — R7303 Prediabetes: Secondary | ICD-10-CM | POA: Diagnosis not present

## 2017-07-20 DIAGNOSIS — M25512 Pain in left shoulder: Secondary | ICD-10-CM | POA: Diagnosis not present

## 2017-07-20 DIAGNOSIS — I10 Essential (primary) hypertension: Secondary | ICD-10-CM | POA: Diagnosis not present

## 2017-07-22 DIAGNOSIS — M25512 Pain in left shoulder: Secondary | ICD-10-CM | POA: Diagnosis not present

## 2017-08-03 DIAGNOSIS — M25512 Pain in left shoulder: Secondary | ICD-10-CM | POA: Diagnosis not present

## 2017-08-04 DIAGNOSIS — M25512 Pain in left shoulder: Secondary | ICD-10-CM | POA: Diagnosis not present

## 2017-08-16 DIAGNOSIS — M25512 Pain in left shoulder: Secondary | ICD-10-CM | POA: Diagnosis not present

## 2017-08-17 DIAGNOSIS — M25512 Pain in left shoulder: Secondary | ICD-10-CM | POA: Diagnosis not present

## 2017-08-18 DIAGNOSIS — M25512 Pain in left shoulder: Secondary | ICD-10-CM | POA: Diagnosis not present

## 2017-09-26 DIAGNOSIS — M25512 Pain in left shoulder: Secondary | ICD-10-CM | POA: Diagnosis not present

## 2017-09-26 DIAGNOSIS — I10 Essential (primary) hypertension: Secondary | ICD-10-CM | POA: Diagnosis not present

## 2017-09-26 DIAGNOSIS — E7849 Other hyperlipidemia: Secondary | ICD-10-CM | POA: Diagnosis not present

## 2018-02-22 DIAGNOSIS — I10 Essential (primary) hypertension: Secondary | ICD-10-CM | POA: Diagnosis not present

## 2018-02-22 DIAGNOSIS — R5383 Other fatigue: Secondary | ICD-10-CM | POA: Diagnosis not present

## 2018-02-22 DIAGNOSIS — M25512 Pain in left shoulder: Secondary | ICD-10-CM | POA: Diagnosis not present

## 2018-02-22 DIAGNOSIS — R7303 Prediabetes: Secondary | ICD-10-CM | POA: Diagnosis not present

## 2018-02-22 DIAGNOSIS — E7849 Other hyperlipidemia: Secondary | ICD-10-CM | POA: Diagnosis not present

## 2018-04-06 DIAGNOSIS — A681 Tick-borne relapsing fever: Secondary | ICD-10-CM | POA: Diagnosis not present

## 2018-08-08 DIAGNOSIS — I10 Essential (primary) hypertension: Secondary | ICD-10-CM | POA: Diagnosis not present

## 2018-08-08 DIAGNOSIS — K2960 Other gastritis without bleeding: Secondary | ICD-10-CM | POA: Diagnosis not present

## 2018-08-08 DIAGNOSIS — A681 Tick-borne relapsing fever: Secondary | ICD-10-CM | POA: Diagnosis not present

## 2018-08-30 DIAGNOSIS — Z96651 Presence of right artificial knee joint: Secondary | ICD-10-CM | POA: Diagnosis not present

## 2018-08-30 DIAGNOSIS — M170 Bilateral primary osteoarthritis of knee: Secondary | ICD-10-CM | POA: Diagnosis not present

## 2018-09-14 DIAGNOSIS — Z96651 Presence of right artificial knee joint: Secondary | ICD-10-CM | POA: Diagnosis not present

## 2018-09-14 DIAGNOSIS — M25562 Pain in left knee: Secondary | ICD-10-CM | POA: Diagnosis not present

## 2018-09-14 DIAGNOSIS — G8929 Other chronic pain: Secondary | ICD-10-CM | POA: Diagnosis not present

## 2018-09-14 DIAGNOSIS — M170 Bilateral primary osteoarthritis of knee: Secondary | ICD-10-CM | POA: Diagnosis not present

## 2018-10-05 DIAGNOSIS — K921 Melena: Secondary | ICD-10-CM | POA: Diagnosis not present

## 2018-12-06 DIAGNOSIS — M778 Other enthesopathies, not elsewhere classified: Secondary | ICD-10-CM | POA: Diagnosis not present

## 2018-12-06 DIAGNOSIS — M180 Bilateral primary osteoarthritis of first carpometacarpal joints: Secondary | ICD-10-CM | POA: Diagnosis not present

## 2018-12-14 DIAGNOSIS — Z96651 Presence of right artificial knee joint: Secondary | ICD-10-CM | POA: Diagnosis not present

## 2018-12-14 DIAGNOSIS — M170 Bilateral primary osteoarthritis of knee: Secondary | ICD-10-CM | POA: Diagnosis not present

## 2019-01-04 DIAGNOSIS — I10 Essential (primary) hypertension: Secondary | ICD-10-CM | POA: Diagnosis not present

## 2019-01-04 DIAGNOSIS — J0191 Acute recurrent sinusitis, unspecified: Secondary | ICD-10-CM | POA: Diagnosis not present

## 2019-01-04 DIAGNOSIS — S134XXA Sprain of ligaments of cervical spine, initial encounter: Secondary | ICD-10-CM | POA: Diagnosis not present

## 2019-05-14 DIAGNOSIS — I10 Essential (primary) hypertension: Secondary | ICD-10-CM | POA: Diagnosis not present

## 2019-05-14 DIAGNOSIS — M1991 Primary osteoarthritis, unspecified site: Secondary | ICD-10-CM | POA: Diagnosis not present

## 2019-06-20 ENCOUNTER — Encounter

## 2019-06-20 ENCOUNTER — Ambulatory Visit: Payer: No Typology Code available for payment source | Admitting: Family Medicine

## 2019-12-19 DIAGNOSIS — M779 Enthesopathy, unspecified: Secondary | ICD-10-CM | POA: Diagnosis not present

## 2019-12-19 DIAGNOSIS — M79671 Pain in right foot: Secondary | ICD-10-CM | POA: Diagnosis not present

## 2020-01-09 DIAGNOSIS — M79671 Pain in right foot: Secondary | ICD-10-CM | POA: Diagnosis not present

## 2020-01-09 DIAGNOSIS — M779 Enthesopathy, unspecified: Secondary | ICD-10-CM | POA: Diagnosis not present

## 2020-01-15 DIAGNOSIS — I10 Essential (primary) hypertension: Secondary | ICD-10-CM | POA: Diagnosis not present

## 2020-01-15 DIAGNOSIS — Z79899 Other long term (current) drug therapy: Secondary | ICD-10-CM | POA: Diagnosis not present

## 2020-01-15 DIAGNOSIS — M1991 Primary osteoarthritis, unspecified site: Secondary | ICD-10-CM | POA: Diagnosis not present

## 2020-01-15 DIAGNOSIS — R7303 Prediabetes: Secondary | ICD-10-CM | POA: Diagnosis not present

## 2020-01-15 DIAGNOSIS — N3942 Incontinence without sensory awareness: Secondary | ICD-10-CM | POA: Diagnosis not present

## 2020-01-15 DIAGNOSIS — K219 Gastro-esophageal reflux disease without esophagitis: Secondary | ICD-10-CM | POA: Diagnosis not present

## 2020-03-12 DIAGNOSIS — N814 Uterovaginal prolapse, unspecified: Secondary | ICD-10-CM | POA: Diagnosis not present

## 2020-03-12 DIAGNOSIS — Z124 Encounter for screening for malignant neoplasm of cervix: Secondary | ICD-10-CM | POA: Diagnosis not present

## 2020-03-14 DIAGNOSIS — Z91010 Allergy to peanuts: Secondary | ICD-10-CM | POA: Diagnosis not present

## 2020-03-14 DIAGNOSIS — Z79899 Other long term (current) drug therapy: Secondary | ICD-10-CM | POA: Diagnosis not present

## 2020-03-14 DIAGNOSIS — I10 Essential (primary) hypertension: Secondary | ICD-10-CM | POA: Diagnosis not present

## 2020-03-14 DIAGNOSIS — R32 Unspecified urinary incontinence: Secondary | ICD-10-CM | POA: Diagnosis not present

## 2020-03-14 DIAGNOSIS — E785 Hyperlipidemia, unspecified: Secondary | ICD-10-CM | POA: Diagnosis not present

## 2020-03-14 DIAGNOSIS — Z9049 Acquired absence of other specified parts of digestive tract: Secondary | ICD-10-CM | POA: Diagnosis not present

## 2020-03-14 DIAGNOSIS — R102 Pelvic and perineal pain: Secondary | ICD-10-CM | POA: Diagnosis not present

## 2020-03-14 DIAGNOSIS — N814 Uterovaginal prolapse, unspecified: Secondary | ICD-10-CM | POA: Diagnosis not present

## 2020-03-14 DIAGNOSIS — Z01818 Encounter for other preprocedural examination: Secondary | ICD-10-CM | POA: Diagnosis not present

## 2020-03-14 DIAGNOSIS — D62 Acute posthemorrhagic anemia: Secondary | ICD-10-CM | POA: Diagnosis not present

## 2020-03-14 DIAGNOSIS — K219 Gastro-esophageal reflux disease without esophagitis: Secondary | ICD-10-CM | POA: Diagnosis not present

## 2020-03-14 DIAGNOSIS — M1990 Unspecified osteoarthritis, unspecified site: Secondary | ICD-10-CM | POA: Diagnosis not present

## 2020-03-14 DIAGNOSIS — Z8673 Personal history of transient ischemic attack (TIA), and cerebral infarction without residual deficits: Secondary | ICD-10-CM | POA: Diagnosis not present

## 2020-03-17 DIAGNOSIS — N814 Uterovaginal prolapse, unspecified: Secondary | ICD-10-CM | POA: Diagnosis not present

## 2020-03-17 DIAGNOSIS — D62 Acute posthemorrhagic anemia: Secondary | ICD-10-CM | POA: Diagnosis not present

## 2020-03-17 DIAGNOSIS — R102 Pelvic and perineal pain: Secondary | ICD-10-CM | POA: Diagnosis not present

## 2020-03-17 DIAGNOSIS — R32 Unspecified urinary incontinence: Secondary | ICD-10-CM | POA: Diagnosis not present

## 2020-03-17 DIAGNOSIS — K219 Gastro-esophageal reflux disease without esophagitis: Secondary | ICD-10-CM | POA: Diagnosis not present

## 2020-03-17 DIAGNOSIS — I10 Essential (primary) hypertension: Secondary | ICD-10-CM | POA: Diagnosis not present

## 2020-03-18 DIAGNOSIS — K219 Gastro-esophageal reflux disease without esophagitis: Secondary | ICD-10-CM | POA: Diagnosis not present

## 2020-03-18 DIAGNOSIS — R102 Pelvic and perineal pain: Secondary | ICD-10-CM | POA: Diagnosis not present

## 2020-03-18 DIAGNOSIS — I10 Essential (primary) hypertension: Secondary | ICD-10-CM | POA: Diagnosis not present

## 2020-03-18 DIAGNOSIS — N814 Uterovaginal prolapse, unspecified: Secondary | ICD-10-CM | POA: Diagnosis not present

## 2020-03-18 DIAGNOSIS — R32 Unspecified urinary incontinence: Secondary | ICD-10-CM | POA: Diagnosis not present

## 2020-03-18 DIAGNOSIS — D62 Acute posthemorrhagic anemia: Secondary | ICD-10-CM | POA: Diagnosis not present

## 2021-04-16 DIAGNOSIS — E119 Type 2 diabetes mellitus without complications: Secondary | ICD-10-CM | POA: Diagnosis not present

## 2021-04-16 DIAGNOSIS — M1991 Primary osteoarthritis, unspecified site: Secondary | ICD-10-CM | POA: Diagnosis not present

## 2021-04-16 DIAGNOSIS — K219 Gastro-esophageal reflux disease without esophagitis: Secondary | ICD-10-CM | POA: Diagnosis not present

## 2021-04-16 DIAGNOSIS — I10 Essential (primary) hypertension: Secondary | ICD-10-CM | POA: Diagnosis not present

## 2021-04-16 DIAGNOSIS — E782 Mixed hyperlipidemia: Secondary | ICD-10-CM | POA: Diagnosis not present

## 2021-04-16 DIAGNOSIS — Z79899 Other long term (current) drug therapy: Secondary | ICD-10-CM | POA: Diagnosis not present

## 2021-04-16 DIAGNOSIS — N3942 Incontinence without sensory awareness: Secondary | ICD-10-CM | POA: Diagnosis not present

## 2021-04-21 DIAGNOSIS — M180 Bilateral primary osteoarthritis of first carpometacarpal joints: Secondary | ICD-10-CM | POA: Diagnosis not present

## 2021-04-21 DIAGNOSIS — M549 Dorsalgia, unspecified: Secondary | ICD-10-CM | POA: Diagnosis not present

## 2021-04-21 DIAGNOSIS — M1712 Unilateral primary osteoarthritis, left knee: Secondary | ICD-10-CM | POA: Diagnosis not present

## 2021-04-21 DIAGNOSIS — M79642 Pain in left hand: Secondary | ICD-10-CM | POA: Diagnosis not present

## 2021-04-21 DIAGNOSIS — M79641 Pain in right hand: Secondary | ICD-10-CM | POA: Diagnosis not present

## 2021-04-21 DIAGNOSIS — M47816 Spondylosis without myelopathy or radiculopathy, lumbar region: Secondary | ICD-10-CM | POA: Diagnosis not present

## 2021-04-23 DIAGNOSIS — R109 Unspecified abdominal pain: Secondary | ICD-10-CM | POA: Diagnosis not present

## 2021-04-23 DIAGNOSIS — Z9049 Acquired absence of other specified parts of digestive tract: Secondary | ICD-10-CM | POA: Diagnosis not present

## 2021-04-23 DIAGNOSIS — K838 Other specified diseases of biliary tract: Secondary | ICD-10-CM | POA: Diagnosis not present

## 2021-04-23 DIAGNOSIS — R1084 Generalized abdominal pain: Secondary | ICD-10-CM | POA: Diagnosis not present

## 2021-05-05 DIAGNOSIS — M180 Bilateral primary osteoarthritis of first carpometacarpal joints: Secondary | ICD-10-CM | POA: Diagnosis not present

## 2021-05-05 DIAGNOSIS — M2560 Stiffness of unspecified joint, not elsewhere classified: Secondary | ICD-10-CM | POA: Diagnosis not present

## 2021-05-05 DIAGNOSIS — M47816 Spondylosis without myelopathy or radiculopathy, lumbar region: Secondary | ICD-10-CM | POA: Diagnosis not present

## 2021-05-05 DIAGNOSIS — M1712 Unilateral primary osteoarthritis, left knee: Secondary | ICD-10-CM | POA: Diagnosis not present

## 2021-05-05 DIAGNOSIS — M8949 Other hypertrophic osteoarthropathy, multiple sites: Secondary | ICD-10-CM | POA: Diagnosis not present

## 2021-05-25 DIAGNOSIS — M25542 Pain in joints of left hand: Secondary | ICD-10-CM | POA: Diagnosis not present

## 2021-05-25 DIAGNOSIS — M25662 Stiffness of left knee, not elsewhere classified: Secondary | ICD-10-CM | POA: Diagnosis not present

## 2021-05-25 DIAGNOSIS — Z9181 History of falling: Secondary | ICD-10-CM | POA: Diagnosis not present

## 2021-05-25 DIAGNOSIS — M2569 Stiffness of other specified joint, not elsewhere classified: Secondary | ICD-10-CM | POA: Diagnosis not present

## 2021-05-25 DIAGNOSIS — M8949 Other hypertrophic osteoarthropathy, multiple sites: Secondary | ICD-10-CM | POA: Diagnosis not present

## 2021-05-25 DIAGNOSIS — M542 Cervicalgia: Secondary | ICD-10-CM | POA: Diagnosis not present

## 2021-05-25 DIAGNOSIS — M47816 Spondylosis without myelopathy or radiculopathy, lumbar region: Secondary | ICD-10-CM | POA: Diagnosis not present

## 2021-05-25 DIAGNOSIS — M1712 Unilateral primary osteoarthritis, left knee: Secondary | ICD-10-CM | POA: Diagnosis not present

## 2021-05-25 DIAGNOSIS — R2689 Other abnormalities of gait and mobility: Secondary | ICD-10-CM | POA: Diagnosis not present

## 2021-05-25 DIAGNOSIS — R29898 Other symptoms and signs involving the musculoskeletal system: Secondary | ICD-10-CM | POA: Diagnosis not present

## 2021-05-25 DIAGNOSIS — M25541 Pain in joints of right hand: Secondary | ICD-10-CM | POA: Diagnosis not present

## 2021-05-25 DIAGNOSIS — M25562 Pain in left knee: Secondary | ICD-10-CM | POA: Diagnosis not present

## 2021-05-25 DIAGNOSIS — M545 Low back pain, unspecified: Secondary | ICD-10-CM | POA: Diagnosis not present

## 2021-05-27 DIAGNOSIS — M8949 Other hypertrophic osteoarthropathy, multiple sites: Secondary | ICD-10-CM | POA: Diagnosis not present

## 2021-05-27 DIAGNOSIS — M47816 Spondylosis without myelopathy or radiculopathy, lumbar region: Secondary | ICD-10-CM | POA: Diagnosis not present

## 2021-05-27 DIAGNOSIS — M2569 Stiffness of other specified joint, not elsewhere classified: Secondary | ICD-10-CM | POA: Diagnosis not present

## 2021-05-27 DIAGNOSIS — M1712 Unilateral primary osteoarthritis, left knee: Secondary | ICD-10-CM | POA: Diagnosis not present

## 2021-05-27 DIAGNOSIS — M25662 Stiffness of left knee, not elsewhere classified: Secondary | ICD-10-CM | POA: Diagnosis not present

## 2021-05-27 DIAGNOSIS — R29898 Other symptoms and signs involving the musculoskeletal system: Secondary | ICD-10-CM | POA: Diagnosis not present

## 2021-05-28 DIAGNOSIS — Z23 Encounter for immunization: Secondary | ICD-10-CM | POA: Diagnosis not present

## 2021-06-03 DIAGNOSIS — M2569 Stiffness of other specified joint, not elsewhere classified: Secondary | ICD-10-CM | POA: Diagnosis not present

## 2021-06-03 DIAGNOSIS — M8949 Other hypertrophic osteoarthropathy, multiple sites: Secondary | ICD-10-CM | POA: Diagnosis not present

## 2021-06-03 DIAGNOSIS — R29898 Other symptoms and signs involving the musculoskeletal system: Secondary | ICD-10-CM | POA: Diagnosis not present

## 2021-06-03 DIAGNOSIS — M47816 Spondylosis without myelopathy or radiculopathy, lumbar region: Secondary | ICD-10-CM | POA: Diagnosis not present

## 2021-06-03 DIAGNOSIS — M25662 Stiffness of left knee, not elsewhere classified: Secondary | ICD-10-CM | POA: Diagnosis not present

## 2021-06-03 DIAGNOSIS — M1712 Unilateral primary osteoarthritis, left knee: Secondary | ICD-10-CM | POA: Diagnosis not present

## 2021-06-05 DIAGNOSIS — M1712 Unilateral primary osteoarthritis, left knee: Secondary | ICD-10-CM | POA: Diagnosis not present

## 2021-06-05 DIAGNOSIS — M8949 Other hypertrophic osteoarthropathy, multiple sites: Secondary | ICD-10-CM | POA: Diagnosis not present

## 2021-06-05 DIAGNOSIS — M2569 Stiffness of other specified joint, not elsewhere classified: Secondary | ICD-10-CM | POA: Diagnosis not present

## 2021-06-05 DIAGNOSIS — R29898 Other symptoms and signs involving the musculoskeletal system: Secondary | ICD-10-CM | POA: Diagnosis not present

## 2021-06-05 DIAGNOSIS — M47816 Spondylosis without myelopathy or radiculopathy, lumbar region: Secondary | ICD-10-CM | POA: Diagnosis not present

## 2021-06-05 DIAGNOSIS — M25662 Stiffness of left knee, not elsewhere classified: Secondary | ICD-10-CM | POA: Diagnosis not present

## 2021-06-10 DIAGNOSIS — M25662 Stiffness of left knee, not elsewhere classified: Secondary | ICD-10-CM | POA: Diagnosis not present

## 2021-06-10 DIAGNOSIS — M47816 Spondylosis without myelopathy or radiculopathy, lumbar region: Secondary | ICD-10-CM | POA: Diagnosis not present

## 2021-06-10 DIAGNOSIS — M8949 Other hypertrophic osteoarthropathy, multiple sites: Secondary | ICD-10-CM | POA: Diagnosis not present

## 2021-06-10 DIAGNOSIS — R29898 Other symptoms and signs involving the musculoskeletal system: Secondary | ICD-10-CM | POA: Diagnosis not present

## 2021-06-10 DIAGNOSIS — M1712 Unilateral primary osteoarthritis, left knee: Secondary | ICD-10-CM | POA: Diagnosis not present

## 2021-06-10 DIAGNOSIS — M2569 Stiffness of other specified joint, not elsewhere classified: Secondary | ICD-10-CM | POA: Diagnosis not present

## 2021-06-12 DIAGNOSIS — M25662 Stiffness of left knee, not elsewhere classified: Secondary | ICD-10-CM | POA: Diagnosis not present

## 2021-06-12 DIAGNOSIS — M8949 Other hypertrophic osteoarthropathy, multiple sites: Secondary | ICD-10-CM | POA: Diagnosis not present

## 2021-06-12 DIAGNOSIS — M2569 Stiffness of other specified joint, not elsewhere classified: Secondary | ICD-10-CM | POA: Diagnosis not present

## 2021-06-12 DIAGNOSIS — M47816 Spondylosis without myelopathy or radiculopathy, lumbar region: Secondary | ICD-10-CM | POA: Diagnosis not present

## 2021-06-12 DIAGNOSIS — R29898 Other symptoms and signs involving the musculoskeletal system: Secondary | ICD-10-CM | POA: Diagnosis not present

## 2021-06-12 DIAGNOSIS — M1712 Unilateral primary osteoarthritis, left knee: Secondary | ICD-10-CM | POA: Diagnosis not present

## 2021-06-16 DIAGNOSIS — M180 Bilateral primary osteoarthritis of first carpometacarpal joints: Secondary | ICD-10-CM | POA: Diagnosis not present

## 2021-06-16 DIAGNOSIS — M8949 Other hypertrophic osteoarthropathy, multiple sites: Secondary | ICD-10-CM | POA: Diagnosis not present

## 2021-06-16 DIAGNOSIS — M47816 Spondylosis without myelopathy or radiculopathy, lumbar region: Secondary | ICD-10-CM | POA: Diagnosis not present

## 2021-06-16 DIAGNOSIS — M2560 Stiffness of unspecified joint, not elsewhere classified: Secondary | ICD-10-CM | POA: Diagnosis not present

## 2021-06-17 DIAGNOSIS — R29898 Other symptoms and signs involving the musculoskeletal system: Secondary | ICD-10-CM | POA: Diagnosis not present

## 2021-06-17 DIAGNOSIS — M8949 Other hypertrophic osteoarthropathy, multiple sites: Secondary | ICD-10-CM | POA: Diagnosis not present

## 2021-06-17 DIAGNOSIS — M47816 Spondylosis without myelopathy or radiculopathy, lumbar region: Secondary | ICD-10-CM | POA: Diagnosis not present

## 2021-06-17 DIAGNOSIS — M25662 Stiffness of left knee, not elsewhere classified: Secondary | ICD-10-CM | POA: Diagnosis not present

## 2021-06-17 DIAGNOSIS — M1712 Unilateral primary osteoarthritis, left knee: Secondary | ICD-10-CM | POA: Diagnosis not present

## 2021-06-17 DIAGNOSIS — M2569 Stiffness of other specified joint, not elsewhere classified: Secondary | ICD-10-CM | POA: Diagnosis not present

## 2021-06-19 DIAGNOSIS — M8949 Other hypertrophic osteoarthropathy, multiple sites: Secondary | ICD-10-CM | POA: Diagnosis not present

## 2021-06-19 DIAGNOSIS — M47816 Spondylosis without myelopathy or radiculopathy, lumbar region: Secondary | ICD-10-CM | POA: Diagnosis not present

## 2021-06-19 DIAGNOSIS — M25662 Stiffness of left knee, not elsewhere classified: Secondary | ICD-10-CM | POA: Diagnosis not present

## 2021-06-19 DIAGNOSIS — M2569 Stiffness of other specified joint, not elsewhere classified: Secondary | ICD-10-CM | POA: Diagnosis not present

## 2021-06-19 DIAGNOSIS — R29898 Other symptoms and signs involving the musculoskeletal system: Secondary | ICD-10-CM | POA: Diagnosis not present

## 2021-06-19 DIAGNOSIS — M1712 Unilateral primary osteoarthritis, left knee: Secondary | ICD-10-CM | POA: Diagnosis not present

## 2021-06-24 DIAGNOSIS — M25651 Stiffness of right hip, not elsewhere classified: Secondary | ICD-10-CM | POA: Diagnosis not present

## 2021-06-24 DIAGNOSIS — R2689 Other abnormalities of gait and mobility: Secondary | ICD-10-CM | POA: Diagnosis not present

## 2021-06-24 DIAGNOSIS — M47816 Spondylosis without myelopathy or radiculopathy, lumbar region: Secondary | ICD-10-CM | POA: Diagnosis not present

## 2021-06-24 DIAGNOSIS — M25652 Stiffness of left hip, not elsewhere classified: Secondary | ICD-10-CM | POA: Diagnosis not present

## 2021-06-24 DIAGNOSIS — M79651 Pain in right thigh: Secondary | ICD-10-CM | POA: Diagnosis not present

## 2021-06-24 DIAGNOSIS — M542 Cervicalgia: Secondary | ICD-10-CM | POA: Diagnosis not present

## 2021-06-24 DIAGNOSIS — M25552 Pain in left hip: Secondary | ICD-10-CM | POA: Diagnosis not present

## 2021-06-24 DIAGNOSIS — M1712 Unilateral primary osteoarthritis, left knee: Secondary | ICD-10-CM | POA: Diagnosis not present

## 2021-06-24 DIAGNOSIS — M25551 Pain in right hip: Secondary | ICD-10-CM | POA: Diagnosis not present

## 2021-06-24 DIAGNOSIS — Z7409 Other reduced mobility: Secondary | ICD-10-CM | POA: Diagnosis not present

## 2021-06-26 DIAGNOSIS — M79651 Pain in right thigh: Secondary | ICD-10-CM | POA: Diagnosis not present

## 2021-06-26 DIAGNOSIS — M47816 Spondylosis without myelopathy or radiculopathy, lumbar region: Secondary | ICD-10-CM | POA: Diagnosis not present

## 2021-06-26 DIAGNOSIS — M1712 Unilateral primary osteoarthritis, left knee: Secondary | ICD-10-CM | POA: Diagnosis not present

## 2021-06-26 DIAGNOSIS — Z7409 Other reduced mobility: Secondary | ICD-10-CM | POA: Diagnosis not present

## 2021-06-26 DIAGNOSIS — R2689 Other abnormalities of gait and mobility: Secondary | ICD-10-CM | POA: Diagnosis not present

## 2021-06-26 DIAGNOSIS — M542 Cervicalgia: Secondary | ICD-10-CM | POA: Diagnosis not present

## 2021-07-01 DIAGNOSIS — R2689 Other abnormalities of gait and mobility: Secondary | ICD-10-CM | POA: Diagnosis not present

## 2021-07-01 DIAGNOSIS — M1712 Unilateral primary osteoarthritis, left knee: Secondary | ICD-10-CM | POA: Diagnosis not present

## 2021-07-01 DIAGNOSIS — Z7409 Other reduced mobility: Secondary | ICD-10-CM | POA: Diagnosis not present

## 2021-07-01 DIAGNOSIS — M542 Cervicalgia: Secondary | ICD-10-CM | POA: Diagnosis not present

## 2021-07-01 DIAGNOSIS — M79651 Pain in right thigh: Secondary | ICD-10-CM | POA: Diagnosis not present

## 2021-07-01 DIAGNOSIS — M47816 Spondylosis without myelopathy or radiculopathy, lumbar region: Secondary | ICD-10-CM | POA: Diagnosis not present

## 2021-07-03 DIAGNOSIS — R2689 Other abnormalities of gait and mobility: Secondary | ICD-10-CM | POA: Diagnosis not present

## 2021-07-03 DIAGNOSIS — Z7409 Other reduced mobility: Secondary | ICD-10-CM | POA: Diagnosis not present

## 2021-07-03 DIAGNOSIS — M79651 Pain in right thigh: Secondary | ICD-10-CM | POA: Diagnosis not present

## 2021-07-03 DIAGNOSIS — M1712 Unilateral primary osteoarthritis, left knee: Secondary | ICD-10-CM | POA: Diagnosis not present

## 2021-07-03 DIAGNOSIS — M47816 Spondylosis without myelopathy or radiculopathy, lumbar region: Secondary | ICD-10-CM | POA: Diagnosis not present

## 2021-07-03 DIAGNOSIS — M542 Cervicalgia: Secondary | ICD-10-CM | POA: Diagnosis not present

## 2021-07-07 DIAGNOSIS — M79651 Pain in right thigh: Secondary | ICD-10-CM | POA: Diagnosis not present

## 2021-07-07 DIAGNOSIS — M47816 Spondylosis without myelopathy or radiculopathy, lumbar region: Secondary | ICD-10-CM | POA: Diagnosis not present

## 2021-07-07 DIAGNOSIS — Z7409 Other reduced mobility: Secondary | ICD-10-CM | POA: Diagnosis not present

## 2021-07-07 DIAGNOSIS — M1712 Unilateral primary osteoarthritis, left knee: Secondary | ICD-10-CM | POA: Diagnosis not present

## 2021-07-07 DIAGNOSIS — M542 Cervicalgia: Secondary | ICD-10-CM | POA: Diagnosis not present

## 2021-07-07 DIAGNOSIS — R2689 Other abnormalities of gait and mobility: Secondary | ICD-10-CM | POA: Diagnosis not present

## 2021-07-10 DIAGNOSIS — R2689 Other abnormalities of gait and mobility: Secondary | ICD-10-CM | POA: Diagnosis not present

## 2021-07-10 DIAGNOSIS — M1712 Unilateral primary osteoarthritis, left knee: Secondary | ICD-10-CM | POA: Diagnosis not present

## 2021-07-10 DIAGNOSIS — M542 Cervicalgia: Secondary | ICD-10-CM | POA: Diagnosis not present

## 2021-07-10 DIAGNOSIS — M79651 Pain in right thigh: Secondary | ICD-10-CM | POA: Diagnosis not present

## 2021-07-10 DIAGNOSIS — M47816 Spondylosis without myelopathy or radiculopathy, lumbar region: Secondary | ICD-10-CM | POA: Diagnosis not present

## 2021-07-10 DIAGNOSIS — Z7409 Other reduced mobility: Secondary | ICD-10-CM | POA: Diagnosis not present

## 2021-07-14 DIAGNOSIS — M47816 Spondylosis without myelopathy or radiculopathy, lumbar region: Secondary | ICD-10-CM | POA: Diagnosis not present

## 2021-07-14 DIAGNOSIS — M79651 Pain in right thigh: Secondary | ICD-10-CM | POA: Diagnosis not present

## 2021-07-14 DIAGNOSIS — M542 Cervicalgia: Secondary | ICD-10-CM | POA: Diagnosis not present

## 2021-07-14 DIAGNOSIS — R2689 Other abnormalities of gait and mobility: Secondary | ICD-10-CM | POA: Diagnosis not present

## 2021-07-14 DIAGNOSIS — M1712 Unilateral primary osteoarthritis, left knee: Secondary | ICD-10-CM | POA: Diagnosis not present

## 2021-07-14 DIAGNOSIS — Z7409 Other reduced mobility: Secondary | ICD-10-CM | POA: Diagnosis not present

## 2021-07-16 DIAGNOSIS — M542 Cervicalgia: Secondary | ICD-10-CM | POA: Diagnosis not present

## 2021-07-16 DIAGNOSIS — Z7409 Other reduced mobility: Secondary | ICD-10-CM | POA: Diagnosis not present

## 2021-07-16 DIAGNOSIS — M79651 Pain in right thigh: Secondary | ICD-10-CM | POA: Diagnosis not present

## 2021-07-16 DIAGNOSIS — M1712 Unilateral primary osteoarthritis, left knee: Secondary | ICD-10-CM | POA: Diagnosis not present

## 2021-07-16 DIAGNOSIS — R2689 Other abnormalities of gait and mobility: Secondary | ICD-10-CM | POA: Diagnosis not present

## 2021-07-16 DIAGNOSIS — M47816 Spondylosis without myelopathy or radiculopathy, lumbar region: Secondary | ICD-10-CM | POA: Diagnosis not present

## 2021-07-22 DIAGNOSIS — M47816 Spondylosis without myelopathy or radiculopathy, lumbar region: Secondary | ICD-10-CM | POA: Diagnosis not present

## 2021-07-22 DIAGNOSIS — M542 Cervicalgia: Secondary | ICD-10-CM | POA: Diagnosis not present

## 2021-07-22 DIAGNOSIS — Z7409 Other reduced mobility: Secondary | ICD-10-CM | POA: Diagnosis not present

## 2021-07-22 DIAGNOSIS — M1712 Unilateral primary osteoarthritis, left knee: Secondary | ICD-10-CM | POA: Diagnosis not present

## 2021-07-22 DIAGNOSIS — R2689 Other abnormalities of gait and mobility: Secondary | ICD-10-CM | POA: Diagnosis not present

## 2021-07-22 DIAGNOSIS — M79651 Pain in right thigh: Secondary | ICD-10-CM | POA: Diagnosis not present

## 2021-07-28 DIAGNOSIS — M8949 Other hypertrophic osteoarthropathy, multiple sites: Secondary | ICD-10-CM | POA: Diagnosis not present

## 2021-07-28 DIAGNOSIS — M4727 Other spondylosis with radiculopathy, lumbosacral region: Secondary | ICD-10-CM | POA: Diagnosis not present

## 2021-07-28 DIAGNOSIS — M1712 Unilateral primary osteoarthritis, left knee: Secondary | ICD-10-CM | POA: Diagnosis not present

## 2021-07-28 DIAGNOSIS — M180 Bilateral primary osteoarthritis of first carpometacarpal joints: Secondary | ICD-10-CM | POA: Diagnosis not present

## 2021-07-28 DIAGNOSIS — M4726 Other spondylosis with radiculopathy, lumbar region: Secondary | ICD-10-CM | POA: Diagnosis not present

## 2021-07-29 DIAGNOSIS — M2569 Stiffness of other specified joint, not elsewhere classified: Secondary | ICD-10-CM | POA: Diagnosis not present

## 2021-07-29 DIAGNOSIS — M545 Low back pain, unspecified: Secondary | ICD-10-CM | POA: Diagnosis not present

## 2021-07-29 DIAGNOSIS — M1712 Unilateral primary osteoarthritis, left knee: Secondary | ICD-10-CM | POA: Diagnosis not present

## 2021-07-29 DIAGNOSIS — M25562 Pain in left knee: Secondary | ICD-10-CM | POA: Diagnosis not present

## 2021-07-29 DIAGNOSIS — M47816 Spondylosis without myelopathy or radiculopathy, lumbar region: Secondary | ICD-10-CM | POA: Diagnosis not present

## 2021-07-29 DIAGNOSIS — M8949 Other hypertrophic osteoarthropathy, multiple sites: Secondary | ICD-10-CM | POA: Diagnosis not present

## 2021-07-29 DIAGNOSIS — M542 Cervicalgia: Secondary | ICD-10-CM | POA: Diagnosis not present

## 2021-08-07 DIAGNOSIS — M4316 Spondylolisthesis, lumbar region: Secondary | ICD-10-CM | POA: Diagnosis not present

## 2021-08-07 DIAGNOSIS — M48062 Spinal stenosis, lumbar region with neurogenic claudication: Secondary | ICD-10-CM | POA: Diagnosis not present

## 2021-08-12 DIAGNOSIS — M4727 Other spondylosis with radiculopathy, lumbosacral region: Secondary | ICD-10-CM | POA: Diagnosis not present

## 2021-08-12 DIAGNOSIS — M5126 Other intervertebral disc displacement, lumbar region: Secondary | ICD-10-CM | POA: Diagnosis not present

## 2021-08-12 DIAGNOSIS — M4726 Other spondylosis with radiculopathy, lumbar region: Secondary | ICD-10-CM | POA: Diagnosis not present

## 2021-08-13 DIAGNOSIS — M4316 Spondylolisthesis, lumbar region: Secondary | ICD-10-CM | POA: Diagnosis not present

## 2021-08-20 DIAGNOSIS — H25813 Combined forms of age-related cataract, bilateral: Secondary | ICD-10-CM | POA: Diagnosis not present

## 2021-08-24 DIAGNOSIS — Z23 Encounter for immunization: Secondary | ICD-10-CM | POA: Diagnosis not present

## 2023-08-24 ENCOUNTER — Encounter (INDEPENDENT_AMBULATORY_CARE_PROVIDER_SITE_OTHER): Payer: Self-pay | Admitting: *Deleted

## 2024-02-22 ENCOUNTER — Encounter (INDEPENDENT_AMBULATORY_CARE_PROVIDER_SITE_OTHER): Payer: Self-pay | Admitting: *Deleted

## 2024-04-26 ENCOUNTER — Encounter (INDEPENDENT_AMBULATORY_CARE_PROVIDER_SITE_OTHER): Payer: Self-pay | Admitting: *Deleted

## 2024-05-02 ENCOUNTER — Ambulatory Visit (INDEPENDENT_AMBULATORY_CARE_PROVIDER_SITE_OTHER): Admitting: Gastroenterology

## 2024-05-02 ENCOUNTER — Encounter (INDEPENDENT_AMBULATORY_CARE_PROVIDER_SITE_OTHER): Payer: Self-pay

## 2024-05-02 ENCOUNTER — Telehealth: Payer: Self-pay | Admitting: *Deleted

## 2024-05-02 ENCOUNTER — Encounter (INDEPENDENT_AMBULATORY_CARE_PROVIDER_SITE_OTHER): Payer: Self-pay | Admitting: Gastroenterology

## 2024-05-02 VITALS — BP 99/64 | HR 58 | Temp 98.2°F | Ht 64.0 in | Wt 180.8 lb

## 2024-05-02 DIAGNOSIS — K529 Noninfective gastroenteritis and colitis, unspecified: Secondary | ICD-10-CM

## 2024-05-02 DIAGNOSIS — D500 Iron deficiency anemia secondary to blood loss (chronic): Secondary | ICD-10-CM | POA: Insufficient documentation

## 2024-05-02 DIAGNOSIS — Z1211 Encounter for screening for malignant neoplasm of colon: Secondary | ICD-10-CM | POA: Insufficient documentation

## 2024-05-02 DIAGNOSIS — R7989 Other specified abnormal findings of blood chemistry: Secondary | ICD-10-CM | POA: Diagnosis not present

## 2024-05-02 DIAGNOSIS — E611 Iron deficiency: Secondary | ICD-10-CM | POA: Diagnosis not present

## 2024-05-02 MED ORDER — PSYLLIUM 58.6 % PO PACK: 1.0000 | PACK | Freq: Two times a day (BID) | ORAL | 2 refills | Status: AC

## 2024-05-02 NOTE — H&P (View-Only) (Signed)
 Jamarien Rodkey Faizan Tashan Kreitzer , M.D. Gastroenterology & Hepatology [facility] Gastroenter[facility] [address] [place], [place] [address] Primary Care Physician: Veda Gerald, MD [address] [place] [address]  Chief Complaint: Chronic diarrhea and Iron deficiency  History of Present Illness: Kristin Kelly is a 78 y.o. female with hypertension who presents for evaluation of chronic diarrhea and iron deficiency.  Patient reports that for many years she has liquid stool 5-6 times a day.  Mostly postprandial 30 minutes to an hour after food intake.The patient denies having any nausea, vomiting, fever, chills, hematochezia, melena, hematemesis, abdominal distention, abdominal pain, diarrhea, jaundice, pruritus or weight loss.Patient reports that she has been trying to eat healthy and since then diet has slightly improved.  Denies any hard stools or straining  Most recent labs from 02/2024 hemoglobin 12.5 platelet 290 creatinine 1.26 GFR 44  Normal liver enzymes  Iron saturation 13 TIBC 440 ferritin 24 Normal Vitamin B12 and folate  Hemoglobin A1c 6.1  last [employer]:UXLK Last Colonoscopy:10 years ago   FHx: neg for any gastrointestinal/liver disease, no malignancies Social: neg smoking, alcohol or illicit drug use  Past Medical History:History reviewed. No pertinent past medical history.  Past Surgical History: Past Surgical History:  Procedure Laterality Date   CHOLECYSTECTOMY      Family History:History reviewed. No pertinent family history.  Social History: Social History   Tobacco Use  Smoking Status Never  Smokeless Tobacco Never   Social History   Substance and Sexual Activity  Alcohol Use Never   Social History   Substance and Sexual Activity  Drug Use Never    Allergies: Allergies  Allergen Reactions   Peanut Oil Dermatitis    Medications: Current Outpatient Medications  Medication Sig Dispense Refill   acetaminophen   (TYLENOL ) 650 MG CR tablet Take 650 mg by mouth.     aspirin EC 81 MG tablet Take 81 mg by mouth daily.     B Complex Vitamins (VITAMIN B-COMPLEX) TABS Take 1 tablet by mouth.     CALCIUM PO Take by mouth.     cholecalciferol (VITAMIN D) 1000 UNITS tablet Take 1,000 Units by mouth daily.     HYDROcodone -acetaminophen  (NORCO) 5-325 MG per tablet Take 1-2 tablets by mouth every 4 (four) hours as needed. 10 tablet 0   ibuprofen (ADVIL,MOTRIN) 200 MG tablet Take 400 mg by mouth every 6 (six) hours as needed.     metoprolol succinate (TOPROL-XL) 25 MG 24 hr tablet Take 25 mg by mouth daily.     Multiple Vitamins-Minerals (MULTIVITAMINS THER. W/MINERALS) TABS tablet Take 1 tablet by mouth daily.     psyllium (METAMUCIL) 58.6 % packet Take 1 packet by mouth 2 (two) times daily. 60 packet 2   Vitamin D, Ergocalciferol, (DRISDOL) 1.25 MG (50000 UNIT) CAPS capsule Take 1,250 mcg by mouth.     No current facility-administered medications for this visit.    Review of Systems: GENERAL: negative for malaise, night sweats HEENT: No changes in hearing or vision, no nose bleeds or other nasal problems. NECK: Negative for lumps, goiter, pain and significant neck swelling RESPIRATORY: Negative for cough, wheezing CARDIOVASCULAR: Negative for chest pain, leg swelling, palpitations, orthopnea GI: SEE HPI MUSCULOSKELETAL: Negative for joint pain or swelling, back pain, and muscle pain. SKIN: Negative for lesions, rash HEMATOLOGY Negative for prolonged bleeding, bruising easily, and swollen nodes. ENDOCRINE: Negative for cold or heat intolerance, polyuria, polydipsia and goiter. NEURO: negative for tremor, gait imbalance, syncope and seizures. The remainder  of the review of systems is noncontributory.   Physical Exam: BP 99/64   Pulse (!) 58   Temp 98.2 F (36.8 C)   Ht 5' 4 (1.626 m)   Wt 180 lb 12.8 oz (82 kg)   BMI 31.03 kg/m   GENERAL: The patient is AO x3, in no acute distress. HEENT: Head is  normocephalic and atraumatic. EOMI are intact. Mouth is well hydrated and without lesions. NECK: Supple. No masses LUNGS: Clear to auscultation. No presence of rhonchi/wheezing/rales. Adequate chest expansion HEART: RRR, normal s1 and s2. ABDOMEN: Soft, nontender, no guarding, no peritoneal signs, and nondistended. BS +. No masses.  Imaging/Labs: as above  I personally reviewed and interpreted the available labs, imaging and endoscopic files.  Ultrasound 2022  Impression  1. Common bile duct dilatation is likely due to previous cholecystectomy. 2. Probable mild hepatic steatosis. 3. No other abnormalities.  Impression and Plan:  Kristin Kelly is a pleasant 78 y.o. female with hypertension who presents for evaluation of chronic diarrhea and iron deficiency.  #Chronic diarrhea #Iron deficiency   Patient has more than 3 loose stools for more than 4 weeks hence qualifies for chronic diarrhea  This could be secondary to secretory , osmotic ,functional ,malabsorptive or inflammatory diarrhea  Patient does have iron deficiency from labs with ferritin less than 45 and iron saturation 13%.    Patient is postmenopausal not a vegan and does not donate blood frequently, hence further investigation for the cause iron deficiency is indicated  Patient diet is low in fiber and recommended Metamucil starting 1 scoop daily for 1 week followed by 2 scoops daily second week, and 3 scoops daily thereafter, to bulk up stool  We will obtain blood work including fecal calprotectin, check for celiac disease with TTG IgA and total IgA, and obtain stool studies   Proceed with EGD with small bowel biopsies and Colonoscopy with random colonic biopsies to r/o microscopic colitis as well.  Patient has history of cholecystectomy and that can be a component of bile acid diarrhea, if above does not explain patient diarrhea may give trial of cholestyramine in future  Ensure adequate fluid intake: Aim for 8  glasses of water daily. Follow a high fiber diet: Include foods such as dates, prunes, pears, and kiwi. Use Metamucil twice a day.  Will obtain abdominal x-ray to evaluate stool burden to ensure we not dealing with overflow diarrhea  #Elevated creatinine   From recent labs it appears patient continues to have gradual rising and creatinine 1.26 GFR 44 Patient does have hypertension although appears to be controlled; I advised patient to follow-up with primary care physician and possibly nephrologist  All questions were answered.      Troy Hartzog Faizan Azia Toutant, MD Gastroenterology and Hepatology [facility] Gastroenterology   This chart has been completed using [facility] Dictation software, and while attempts have been made to ensure accuracy , certain words and phrases may not be transcribed as intended

## 2024-05-02 NOTE — Progress Notes (Signed)
 Jamarien Rodkey Faizan Tashan Kreitzer , M.D. Gastroenterology & Hepatology [facility]enter[facility] [address] [place], [place] [address] Primary Care Physician: Veda Gerald, MD [address] [place] [address]  Chief Complaint: Chronic diarrhea and Iron deficiency  History of Present Illness: Kristin Kelly is a 78 y.o. female with hypertension who presents for evaluation of chronic diarrhea and iron deficiency.  Patient reports that for many years she has liquid stool 5-6 times a day.  Mostly postprandial 30 minutes to an hour after food intake.The patient denies having any nausea, vomiting, fever, chills, hematochezia, melena, hematemesis, abdominal distention, abdominal pain, diarrhea, jaundice, pruritus or weight loss.Patient reports that she has been trying to eat healthy and since then diet has slightly improved.  Denies any hard stools or straining  Most recent labs from 02/2024 hemoglobin 12.5 platelet 290 creatinine 1.26 GFR 44  Normal liver enzymes  Iron saturation 13 TIBC 440 ferritin 24 Normal Vitamin B12 and folate  Hemoglobin A1c 6.1  last [employer] Last Colonoscopy:10 years ago   FHx: neg for any gastrointestinal/liver disease, no malignancies Social: neg smoking, alcohol or illicit drug use  Past Medical History:History reviewed. No pertinent past medical history.  Past Surgical History: Past Surgical History:  Procedure Laterality Date   CHOLECYSTECTOMY      Family History:History reviewed. No pertinent family history.  Social History: Social History   Tobacco Use  Smoking Status Never  Smokeless Tobacco Never   Social History   Substance and Sexual Activity  Alcohol Use Never   Social History   Substance and Sexual Activity  Drug Use Never    Allergies: Allergies  Allergen Reactions   Peanut Oil Dermatitis    Medications: Current Outpatient Medications  Medication Sig Dispense Refill   acetaminophen   (TYLENOL ) 650 MG CR tablet Take 650 mg by mouth.     aspirin EC 81 MG tablet Take 81 mg by mouth daily.     B Complex Vitamins (VITAMIN B-COMPLEX) TABS Take 1 tablet by mouth.     CALCIUM PO Take by mouth.     cholecalciferol (VITAMIN D) 1000 UNITS tablet Take 1,000 Units by mouth daily.     HYDROcodone -acetaminophen  (NORCO) 5-325 MG per tablet Take 1-2 tablets by mouth every 4 (four) hours as needed. 10 tablet 0   ibuprofen (ADVIL,MOTRIN) 200 MG tablet Take 400 mg by mouth every 6 (six) hours as needed.     metoprolol succinate (TOPROL-XL) 25 MG 24 hr tablet Take 25 mg by mouth daily.     Multiple Vitamins-Minerals (MULTIVITAMINS THER. W/MINERALS) TABS tablet Take 1 tablet by mouth daily.     psyllium (METAMUCIL) 58.6 % packet Take 1 packet by mouth 2 (two) times daily. 60 packet 2   Vitamin D, Ergocalciferol, (DRISDOL) 1.25 MG (50000 UNIT) CAPS capsule Take 1,250 mcg by mouth.     No current facility-administered medications for this visit.    Review of Systems: GENERAL: negative for malaise, night sweats HEENT: No changes in hearing or vision, no nose bleeds or other nasal problems. NECK: Negative for lumps, goiter, pain and significant neck swelling RESPIRATORY: Negative for cough, wheezing CARDIOVASCULAR: Negative for chest pain, leg swelling, palpitations, orthopnea GI: SEE HPI MUSCULOSKELETAL: Negative for joint pain or swelling, back pain, and muscle pain. SKIN: Negative for lesions, rash HEMATOLOGY Negative for prolonged bleeding, bruising easily, and swollen nodes. ENDOCRINE: Negative for cold or heat intolerance, polyuria, polydipsia and goiter. NEURO: negative for tremor, gait imbalance, syncope and seizures. The remainder  of the review of systems is noncontributory.   Physical Exam: BP 99/64   Pulse (!) 58   Temp 98.2 F (36.8 C)   Ht 5' 4 (1.626 m)   Wt 180 lb 12.8 oz (82 kg)   BMI 31.03 kg/m   GENERAL: The patient is AO x3, in no acute distress. HEENT: Head is  normocephalic and atraumatic. EOMI are intact. Mouth is well hydrated and without lesions. NECK: Supple. No masses LUNGS: Clear to auscultation. No presence of rhonchi/wheezing/rales. Adequate chest expansion HEART: RRR, normal s1 and s2. ABDOMEN: Soft, nontender, no guarding, no peritoneal signs, and nondistended. BS +. No masses.  Imaging/Labs: as above  I personally reviewed and interpreted the available labs, imaging and endoscopic files.  Ultrasound 2022  Impression  1. Common bile duct dilatation is likely due to previous cholecystectomy. 2. Probable mild hepatic steatosis. 3. No other abnormalities.  Impression and Plan:  Kristin Kelly is a pleasant 78 y.o. female with hypertension who presents for evaluation of chronic diarrhea and iron deficiency.  #Chronic diarrhea #Iron deficiency   Patient has more than 3 loose stools for more than 4 weeks hence qualifies for chronic diarrhea  This could be secondary to secretory , osmotic ,functional ,malabsorptive or inflammatory diarrhea  Patient does have iron deficiency from labs with ferritin less than 45 and iron saturation 13%.    Patient is postmenopausal not a vegan and does not donate blood frequently, hence further investigation for the cause iron deficiency is indicated  Patient diet is low in fiber and recommended Metamucil starting 1 scoop daily for 1 week followed by 2 scoops daily second week, and 3 scoops daily thereafter, to bulk up stool  We will obtain blood work including fecal calprotectin, check for celiac disease with TTG IgA and total IgA, and obtain stool studies   Proceed with EGD with small bowel biopsies and Colonoscopy with random colonic biopsies to r/o microscopic colitis as well.  Patient has history of cholecystectomy and that can be a component of bile acid diarrhea, if above does not explain patient diarrhea may give trial of cholestyramine in future  Ensure adequate fluid intake: Aim for 8  glasses of water daily. Follow a high fiber diet: Include foods such as dates, prunes, pears, and kiwi. Use Metamucil twice a day.  Will obtain abdominal x-ray to evaluate stool burden to ensure we not dealing with overflow diarrhea  #Elevated creatinine   From recent labs it appears patient continues to have gradual rising and creatinine 1.26 GFR 44 Patient does have hypertension although appears to be controlled; I advised patient to follow-up with primary care physician and possibly nephrologist  All questions were answered.      Troy Hartzog Faizan Azia Toutant, MD Gastroenterology and Hepatology [facility] Gastroenterology   This chart has been completed using [facility] Dictation software, and while attempts have been made to ensure accuracy , certain words and phrases may not be transcribed as intended

## 2024-05-02 NOTE — Telephone Encounter (Signed)
 Called pt, she is checking with friend to see when they can take her for procedure  Needs TCS/EGD with Dr. Alita Irwin, any room

## 2024-05-02 NOTE — Patient Instructions (Addendum)
 It was very nice to meet you today, as dicussed with will plan for the following :  1) Labwork , and stool samples  2) Abdominal xray   3) Upper endoscopy and colonoscopy  4) Ensure adequate fluid intake: Aim for 8 glasses of water daily. Follow a high fiber diet: Include foods such as dates, prunes, pears, and kiwi. Use Metamucil twice a day.  5) please see a nephrologist for elevated kidney function

## 2024-05-07 ENCOUNTER — Other Ambulatory Visit (INDEPENDENT_AMBULATORY_CARE_PROVIDER_SITE_OTHER): Payer: Self-pay | Admitting: Gastroenterology

## 2024-05-08 ENCOUNTER — Encounter: Payer: Self-pay | Admitting: *Deleted

## 2024-05-08 ENCOUNTER — Other Ambulatory Visit (INDEPENDENT_AMBULATORY_CARE_PROVIDER_SITE_OTHER): Payer: Self-pay | Admitting: Gastroenterology

## 2024-05-08 ENCOUNTER — Other Ambulatory Visit: Payer: Self-pay | Admitting: *Deleted

## 2024-05-08 LAB — C-REACTIVE PROTEIN: CRP: 1 mg/L (ref 0–10)

## 2024-05-08 MED ORDER — PEG 3350-KCL-NA BICARB-NACL 420 G PO SOLR: 4000.0000 mL | Freq: Once | ORAL | 0 refills | Status: AC

## 2024-05-08 NOTE — Telephone Encounter (Signed)
 Called pt, LMOVM to call back to see if she is ready to schedule

## 2024-05-08 NOTE — Telephone Encounter (Signed)
 Pt has been scheduled for 05/25/24. Instructions mailed and prep sent to pharmacy.

## 2024-05-08 NOTE — Telephone Encounter (Signed)
 Pt called back. She stated will speak with her friend to see what date she can bring her and call us  back

## 2024-05-09 LAB — IGA: IgA/Immunoglobulin A, Serum: 345 mg/dL (ref 64–422)

## 2024-05-09 LAB — TISSUE TRANSGLUTAMINASE ABS,IGG,IGA

## 2024-05-10 LAB — C DIFFICILE, CYTOTOXIN B

## 2024-05-12 LAB — GI PROFILE, STOOL, PCR

## 2024-05-12 LAB — C DIFFICILE TOXINS A+B W/RFLX

## 2024-05-12 LAB — C DIFFICILE, CYTOTOXIN B

## 2024-05-12 LAB — CALPROTECTIN, FECAL: Calprotectin, Fecal: 32 ug/g (ref 0–120)

## 2024-05-14 ENCOUNTER — Other Ambulatory Visit (INDEPENDENT_AMBULATORY_CARE_PROVIDER_SITE_OTHER): Payer: Self-pay | Admitting: Gastroenterology

## 2024-05-14 ENCOUNTER — Ambulatory Visit (INDEPENDENT_AMBULATORY_CARE_PROVIDER_SITE_OTHER): Payer: Self-pay | Admitting: Gastroenterology

## 2024-05-14 MED ORDER — ASPIRIN EC 81 MG PO TBEC: 81.0000 mg | DELAYED_RELEASE_TABLET | Freq: Every day | ORAL | 3 refills | Status: AC

## 2024-05-15 ENCOUNTER — Ambulatory Visit (HOSPITAL_COMMUNITY)
Admission: RE | Admit: 2024-05-15 | Discharge: 2024-05-15 | Disposition: A | Discharge status: Home / Self Care | Source: Ambulatory Visit | Attending: Gastroenterology | Admitting: Gastroenterology

## 2024-05-15 DIAGNOSIS — D500 Iron deficiency anemia secondary to blood loss (chronic): Secondary | ICD-10-CM | POA: Diagnosis present

## 2024-05-15 DIAGNOSIS — K529 Noninfective gastroenteritis and colitis, unspecified: Secondary | ICD-10-CM | POA: Insufficient documentation

## 2024-05-22 ENCOUNTER — Encounter (HOSPITAL_COMMUNITY)
Admission: RE | Admit: 2024-05-22 | Discharge: 2024-05-22 | Disposition: A | Discharge status: Home / Self Care | Source: Ambulatory Visit | Attending: Gastroenterology | Admitting: Gastroenterology

## 2024-05-22 ENCOUNTER — Other Ambulatory Visit: Payer: Self-pay

## 2024-05-22 ENCOUNTER — Encounter (HOSPITAL_COMMUNITY): Payer: Self-pay

## 2024-05-22 HISTORY — DX: Sleep apnea, unspecified: G47.30

## 2024-05-22 HISTORY — DX: Essential (primary) hypertension: I10

## 2024-05-24 ENCOUNTER — Other Ambulatory Visit: Payer: Self-pay | Admitting: *Deleted

## 2024-05-24 MED ORDER — PEG 3350-KCL-NA BICARB-NACL 420 G PO SOLR: 4000.0000 mL | Freq: Once | ORAL | 0 refills | Status: AC

## 2024-05-25 ENCOUNTER — Encounter (HOSPITAL_COMMUNITY): Payer: Self-pay | Admitting: Gastroenterology

## 2024-05-25 ENCOUNTER — Ambulatory Visit (HOSPITAL_COMMUNITY): Admitting: Anesthesiology

## 2024-05-25 ENCOUNTER — Ambulatory Visit (HOSPITAL_COMMUNITY)
Admission: RE | Admit: 2024-05-25 | Discharge: 2024-05-25 | Disposition: A | Discharge status: Home / Self Care | Attending: Gastroenterology | Admitting: Gastroenterology

## 2024-05-25 ENCOUNTER — Ambulatory Visit (HOSPITAL_BASED_OUTPATIENT_CLINIC_OR_DEPARTMENT_OTHER): Admitting: Anesthesiology

## 2024-05-25 ENCOUNTER — Encounter (HOSPITAL_COMMUNITY)
Admission: RE | Disposition: A | Discharge status: Home / Self Care | Payer: Self-pay | Source: Home / Self Care | Attending: Gastroenterology

## 2024-05-25 DIAGNOSIS — K2970 Gastritis, unspecified, without bleeding: Secondary | ICD-10-CM

## 2024-05-25 DIAGNOSIS — D509 Iron deficiency anemia, unspecified: Secondary | ICD-10-CM | POA: Diagnosis not present

## 2024-05-25 DIAGNOSIS — B9681 Helicobacter pylori [H. pylori] as the cause of diseases classified elsewhere: Secondary | ICD-10-CM

## 2024-05-25 DIAGNOSIS — K648 Other hemorrhoids: Secondary | ICD-10-CM

## 2024-05-25 DIAGNOSIS — D122 Benign neoplasm of ascending colon: Secondary | ICD-10-CM | POA: Diagnosis not present

## 2024-05-25 DIAGNOSIS — K2950 Unspecified chronic gastritis without bleeding: Secondary | ICD-10-CM | POA: Diagnosis not present

## 2024-05-25 DIAGNOSIS — K529 Noninfective gastroenteritis and colitis, unspecified: Secondary | ICD-10-CM | POA: Insufficient documentation

## 2024-05-25 DIAGNOSIS — K3189 Other diseases of stomach and duodenum: Secondary | ICD-10-CM | POA: Insufficient documentation

## 2024-05-25 DIAGNOSIS — K5520 Angiodysplasia of colon without hemorrhage: Secondary | ICD-10-CM

## 2024-05-25 DIAGNOSIS — I10 Essential (primary) hypertension: Secondary | ICD-10-CM | POA: Diagnosis not present

## 2024-05-25 DIAGNOSIS — G4730 Sleep apnea, unspecified: Secondary | ICD-10-CM | POA: Diagnosis not present

## 2024-05-25 DIAGNOSIS — K909 Intestinal malabsorption, unspecified: Secondary | ICD-10-CM

## 2024-05-25 LAB — HM COLONOSCOPY

## 2024-05-25 SURGERY — COLONOSCOPY
Anesthesia: General

## 2024-05-25 MED ORDER — CHOLESTYRAMINE 4 G PO PACK
4.0000 g | PACK | Freq: Every day | ORAL | 1 refills | Status: AC
Start: 2024-05-25 — End: 2024-07-24

## 2024-05-25 MED ORDER — PHENYLEPHRINE 80 MCG/ML (10ML) SYRINGE FOR IV PUSH (FOR BLOOD PRESSURE SUPPORT)
PREFILLED_SYRINGE | INTRAVENOUS | Status: DC | PRN
  Administered 2024-05-25 (×2): 160 ug via INTRAVENOUS

## 2024-05-25 MED ORDER — LACTATED RINGERS IV SOLN: INTRAVENOUS | Status: DC | PRN

## 2024-05-25 MED ORDER — FERROUS GLUCONATE 324 (38 FE) MG PO TABS: 324.0000 mg | ORAL_TABLET | ORAL | Status: AC

## 2024-05-25 MED ORDER — LACTATED RINGERS IV SOLN: INTRAVENOUS | Status: DC

## 2024-05-25 MED ORDER — PROPOFOL 10 MG/ML IV BOLUS
INTRAVENOUS | Status: DC | PRN
  Administered 2024-05-25: 60 mg via INTRAVENOUS
  Administered 2024-05-25: 40 mg via INTRAVENOUS

## 2024-05-25 MED ORDER — LIDOCAINE 2% (20 MG/ML) 5 ML SYRINGE
INTRAMUSCULAR | Status: DC | PRN
  Administered 2024-05-25: 100 mg via INTRAVENOUS

## 2024-05-25 MED ORDER — PROPOFOL 500 MG/50ML IV EMUL
INTRAVENOUS | Status: DC | PRN
  Administered 2024-05-25: 150 ug/kg/min via INTRAVENOUS

## 2024-05-25 NOTE — Op Note (Signed)
 [facility] Patient Name: Kristin Kelly Procedure Date: 05/25/2024 10:12 AM MRN: [id number] Date of Birth: Jun 18, 1946 Attending MD: Deatrice Dine , MD, [id number] CSN: [id number] Age: 78 Admit Type: Outpatient Procedure:                Colonoscopy Indications:              Chronic diarrhea, Unexplained iron deficiency anemia Providers:                Deatrice Dine, MD, Crystal Page, Alm Dorcas Balm., Technician Referring MD:              Medicines:                Monitored Anesthesia Care Complications:            No immediate complications. Estimated Blood Loss:     Estimated blood loss was minimal. Procedure:                Pre-Anesthesia Assessment:                           - Prior to the procedure, a History and Physical                            was performed, and patient medications and                            allergies were reviewed. The patient's tolerance of                            previous anesthesia was also reviewed. The risks                            and benefits of the procedure and the sedation                            options and risks were discussed with the patient.                            All questions were answered, and informed consent                            was obtained. Prior Anticoagulants: The patient has                            taken no anticoagulant or antiplatelet agents                            except for aspirin . ASA Grade Assessment: II - A                            patient with mild systemic disease. After reviewing  the risks and benefits, the patient was deemed in                            satisfactory condition to undergo the procedure.                           After obtaining informed consent, the colonoscope                            was passed under direct vision. Throughout the                            procedure, the patient's blood pressure, pulse, and                             oxygen  saturations were monitored continuously. The                            [id number]-HQ190L ([id number]) scope was introduced through                            the anus and advanced to the the cecum, identified                            by appendiceal orifice and ileocecal valve. The                            colonoscopy was performed without difficulty. The                            patient tolerated the procedure well. The quality                            of the bowel preparation was evaluated using the                            BBPS [facility] Bowel Preparation Scale) with scores                            of: Right Colon = 3, Transverse Colon = 3 and Left                            Colon = 3 (entire mucosa seen well with no residual                            staining, small fragments of stool or opaque                            liquid). The total BBPS score equals 9. Scope In: 10:46:23 AM Scope Out: 11:06:04 AM Scope Withdrawal Time: 0 hours 16 minutes 23 seconds  Total Procedure Duration: 0 hours 19 minutes 41 seconds  Findings:      A single medium-sized angioectasia without bleeding was found  in the       cecum. Coagulation for bleeding prevention using argon plasma at 0.3       liters/minute and 20 watts was successful.      A 4 mm polyp was found in the ascending colon. The polyp was sessile.       The polyp was removed with a cold snare. Resection and retrieval were       complete.      There is no endoscopic evidence of inflammation in the entire colon.       Biopsies for histology were taken with a cold forceps for evaluation of       microscopic colitis.      Non-bleeding internal hemorrhoids were found during retroflexion. The       hemorrhoids were small. Impression:               - A single non-bleeding colonic angioectasia.                            Treated with argon plasma coagulation (APC).                           - One 4 mm polyp in  the ascending colon, removed                            with a cold snare. Resected and retrieved.                           - Non-bleeding internal hemorrhoids. Moderate Sedation:      Per Anesthesia Care Recommendation:           - Patient has a contact number available for                            emergencies. The signs and symptoms of potential                            delayed complications were discussed with the                            patient. Return to normal activities tomorrow.                            Written discharge instructions were provided to the                            patient.                           - Resume previous diet.                           - Continue present medications.                           - Await pathology results.                           -  No repeat colonoscopy due to current age (4                            years or older).                           - Return to GI clinic as previously scheduled.                           -Start iron supplementation every other day. Trail                            of cholestyramine  for diarrhea Procedure Code(s):        --- Professional ---                           [phone], 59, Colonoscopy, flexible; with control of                            bleeding, any method                           45385, Colonoscopy, flexible; with removal of                            tumor(s), polyp(s), or other lesion(s) by snare                            technique                           45380, 59, Colonoscopy, flexible; with biopsy,                            single or multiple Diagnosis Code(s):        --- Professional ---                           K55.20, Angiodysplasia of colon without hemorrhage                           D12.2, Benign neoplasm of ascending colon                           K64.8, Other hemorrhoids                           K52.9, Noninfective gastroenteritis and colitis,                             unspecified                           D50.9, Iron deficiency anemia, unspecified CPT copyright 2022 American Medical Association. All rights reserved. The codes documented in this report are preliminary and upon coder review may  be revised to meet current compliance requirements. Channelle Bottger, MD Deatrice Dine, MD 05/25/2024  11:18:36 AM This report has been signed electronically. Number of Addenda: 0

## 2024-05-25 NOTE — Op Note (Signed)
 [facility] Patient Name: Kristin Kelly Procedure Date: 05/25/2024 10:13 AM MRN: [id number] Date of Birth: 1946/10/16 Attending MD: Deatrice Dine , MD, [id number] CSN: [id number] Age: 78 Admit Type: Outpatient Procedure:                Upper GI endoscopy Indications:              Unexplained iron deficiency anemia, Diarrhea Providers:                Deatrice Dine, MD, Crystal Page, Alm Dorcas Balm., Technician Referring MD:              Medicines:                Monitored Anesthesia Care Complications:            No immediate complications. Estimated Blood Loss:     Estimated blood loss: none. Procedure:                Pre-Anesthesia Assessment:                           - Prior to the procedure, a History and Physical                            was performed, and patient medications and                            allergies were reviewed. The patient's tolerance of                            previous anesthesia was also reviewed. The risks                            and benefits of the procedure and the sedation                            options and risks were discussed with the patient.                            All questions were answered, and informed consent                            was obtained. Prior Anticoagulants: The patient has                            taken no anticoagulant or antiplatelet agents                            except for aspirin . ASA Grade Assessment: II - A                            patient with mild systemic disease. After reviewing  the risks and benefits, the patient was deemed in                            satisfactory condition to undergo the procedure.                           After obtaining informed consent, the endoscope was                            passed under direct vision. Throughout the                            procedure, the patient's blood pressure, pulse, and                             oxygen  saturations were monitored continuously. The                            [id number]-[id number] ([id number]) scope was introduced through the                            mouth, and advanced to the second part of duodenum.                            The upper GI endoscopy was accomplished without                            difficulty. The patient tolerated the procedure                            well. Scope In: 10:34:39 AM Scope Out: 10:40:13 AM Total Procedure Duration: 0 hours 5 minutes 34 seconds  Findings:      The examined esophagus was normal.      Mildly erythematous mucosa without bleeding was found in the gastric       antrum. This was biopsied with a cold forceps for histology.      The duodenal bulb and second portion of the duodenum were normal.       Biopsies were taken with a cold forceps for histology. Impression:               - Normal esophagus.                           - Erythematous mucosa in the antrum. Biopsied.                           - Normal duodenal bulb and second portion of the                            duodenum. Biopsied. Moderate Sedation:      Per Anesthesia Care Recommendation:           - Patient has a contact number available for  emergencies. The signs and symptoms of potential                            delayed complications were discussed with the                            patient. Return to normal activities tomorrow.                            Written discharge instructions were provided to the                            patient.                           - Resume previous diet.                           - Continue present medications.                           - Await pathology results. Procedure Code(s):        --- Professional ---                           [phone], Esophagogastroduodenoscopy, flexible,                            transoral; with biopsy, single or multiple Diagnosis Code(s):        ---  Professional ---                           K31.89, Other diseases of stomach and duodenum                           D50.9, Iron deficiency anemia, unspecified                           R19.7, Diarrhea, unspecified CPT copyright 2022 American Medical Association. All rights reserved. The codes documented in this report are preliminary and upon coder review may  be revised to meet current compliance requirements. Deatrice Dine, MD Deatrice Dine, MD 05/25/2024 10:43:41 AM This report has been signed electronically. Number of Addenda: 0

## 2024-05-25 NOTE — Transfer of Care (Signed)
 Immediate Anesthesia Transfer of Care Note  Patient: Kristin Kelly  Procedure(s) Performed: COLONOSCOPY EGD (ESOPHAGOGASTRODUODENOSCOPY)  Patient Location: Short Stay  Anesthesia Type:General  Level of Consciousness: awake, alert , oriented, and patient cooperative  Airway & Oxygen  Therapy: Patient Spontanous Breathing  Post-op Assessment: Report given to RN, Post -op Vital signs reviewed and stable, and Patient moving all extremities X 4  Post vital signs: Reviewed and stable  Last Vitals:  Vitals Value Taken Time  BP 131/53 05/25/24 11:13  Temp    Pulse 72 05/25/24 11:13  Resp 18 05/25/24 11:13  SpO2 100 % 05/25/24 11:13    Last Pain:  Vitals:   05/25/24 1113  TempSrc: Oral  PainSc: 0-No pain      Patients Stated Pain Goal: 5 (05/25/24 1113)  Complications: No notable events documented.

## 2024-05-25 NOTE — Interval H&P Note (Signed)
 History and Physical Interval Note:  05/25/2024 10:16 AM  Kristin Kelly  has presented today for surgery, with the diagnosis of chronic diarrhea,IDA.  The various methods of treatment have been discussed with the patient and family. After consideration of risks, benefits and other options for treatment, the patient has consented to  Procedure(s) with comments: COLONOSCOPY (N/A) - 11:00 am, asa 1/2 EGD (ESOPHAGOGASTRODUODENOSCOPY) (N/A) as a surgical intervention.  The patient's history has been reviewed, patient examined, no change in status, stable for surgery.  I have reviewed the patient's chart and labs.  Questions were answered to the patient's satisfaction.     Kristin Kelly [name]

## 2024-05-25 NOTE — Discharge Instructions (Addendum)
  Discharge instructions Please read the instructions outlined below and refer to this sheet in the next few weeks. These discharge instructions provide you with general information on caring for yourself after you leave the hospital. Your doctor may also give you specific instructions. While your treatment has been planned according to the most current medical practices available, unavoidable complications occasionally occur. If you have any problems or questions after discharge, please call your doctor. ACTIVITY You may resume your regular activity but move at a slower pace for the next 24 hours.  Take frequent rest periods for the next 24 hours.  Walking will help expel (get rid of) the air and reduce the bloated feeling in your abdomen.  No driving for 24 hours (because of the anesthesia (medicine) used during the test).  You may shower.  Do not sign any important legal documents or operate any machinery for 24 hours (because of the anesthesia used during the test).  NUTRITION Drink plenty of fluids.  You may resume your normal diet.  Begin with a light meal and progress to your normal diet.  Avoid alcoholic beverages for 24 hours or as instructed by your caregiver.  MEDICATIONS You may resume your normal medications unless your caregiver tells you otherwise.  WHAT YOU CAN EXPECT TODAY You may experience abdominal discomfort such as a feeling of fullness or "gas" pains.  FOLLOW-UP Your doctor will discuss the results of your test with you.  SEEK IMMEDIATE MEDICAL ATTENTION IF ANY OF THE FOLLOWING OCCUR: Excessive nausea (feeling sick to your stomach) and/or vomiting.  Severe abdominal pain and distention (swelling).  Trouble swallowing.  Temperature over 101 F (37.8 C).  Rectal bleeding or vomiting of blood.    1)Start Oral iron tablets every other day  2)If you continue to have diarrhea start cholestyramine  ; Mix with 4-6 oz liquid.  Take other meds 1 hr before or 4-6 hr after  cholestyramine .   3)Avoid  using high dose aspirin  including Goody/BC powders, NSAIDs such as Aleve, ibuprofen, naproxen, Motrin, Voltaren or Advil (even the topical ones) for atleast 14 days    I hope you have a great rest of your week!   Shylee Durrett Faizan Davy Faught , M.D.. Gastroenterology and Hepatology [facility]

## 2024-05-25 NOTE — Anesthesia Postprocedure Evaluation (Signed)
 Anesthesia Post Note  Patient: Kristin Kelly  Procedure(s) Performed: COLONOSCOPY EGD (ESOPHAGOGASTRODUODENOSCOPY)  Patient location during evaluation: Phase II Anesthesia Type: General Level of consciousness: awake Pain management: pain level controlled Vital Signs Assessment: post-procedure vital signs reviewed and stable Respiratory status: spontaneous breathing and respiratory function stable Cardiovascular status: blood pressure returned to baseline and stable Postop Assessment: no headache and no apparent nausea or vomiting Anesthetic complications: no Comments: Late entry   No notable events documented.   Last Vitals:  Vitals:   05/25/24 1000 05/25/24 1113  BP: (!) 161/77 (!) 131/53  Pulse: 91 72  Resp: 17 18  Temp: 36.7 C   SpO2: 100% 100%    Last Pain:  Vitals:   05/25/24 1113  TempSrc: Oral  PainSc: 0-No pain                 Yvonna JINNY Bosworth

## 2024-05-25 NOTE — Anesthesia Preprocedure Evaluation (Signed)
 Anesthesia Evaluation  Patient identified by MRN, date of birth, ID band Patient awake    Reviewed: Allergy & Precautions, H&P , NPO status , Patient's Chart, lab work & pertinent test results, reviewed documented beta blocker date and time   Airway Mallampati: II  TM Distance: >3 FB Neck ROM: full    Dental no notable dental hx.    Pulmonary neg pulmonary ROS, sleep apnea    Pulmonary exam normal breath sounds clear to auscultation       Cardiovascular Exercise Tolerance: Good hypertension, negative cardio ROS  Rhythm:regular Rate:Normal     Neuro/Psych negative neurological ROS  negative psych ROS   GI/Hepatic negative GI ROS, Neg liver ROS,,,  Endo/Other  negative endocrine ROS    Renal/GU negative Renal ROS  negative genitourinary   Musculoskeletal   Abdominal   Peds  Hematology negative hematology ROS (+) Blood dyscrasia, anemia   Anesthesia Other Findings   Reproductive/Obstetrics negative OB ROS                              Anesthesia Physical Anesthesia Plan  ASA: 2  Anesthesia Plan: General   Post-op Pain Management:    Induction:   PONV Risk Score and Plan: Propofol  infusion  Airway Management Planned:   Additional Equipment:   Intra-op Plan:   Post-operative Plan:   Informed Consent: I have reviewed the patients History and Physical, chart, labs and discussed the procedure including the risks, benefits and alternatives for the proposed anesthesia with the patient or authorized representative who has indicated his/her understanding and acceptance.     Dental Advisory Given  Plan Discussed with: CRNA  Anesthesia Plan Comments:         Anesthesia Quick Evaluation

## 2024-05-28 ENCOUNTER — Encounter (HOSPITAL_COMMUNITY): Payer: Self-pay | Admitting: Gastroenterology

## 2024-05-28 ENCOUNTER — Encounter (INDEPENDENT_AMBULATORY_CARE_PROVIDER_SITE_OTHER): Payer: Self-pay | Admitting: *Deleted

## 2024-05-28 LAB — SURGICAL PATHOLOGY

## 2024-05-29 ENCOUNTER — Ambulatory Visit (INDEPENDENT_AMBULATORY_CARE_PROVIDER_SITE_OTHER): Payer: Self-pay | Admitting: Gastroenterology

## 2024-05-29 DIAGNOSIS — A048 Other specified bacterial intestinal infections: Secondary | ICD-10-CM

## 2024-05-29 DIAGNOSIS — K2970 Gastritis, unspecified, without bleeding: Secondary | ICD-10-CM

## 2024-05-29 MED ORDER — BISMUTH/METRONIDAZ/TETRACYCLIN 140-125-125 MG PO CAPS: 3.0000 | ORAL_CAPSULE | Freq: Four times a day (QID) | ORAL | 0 refills | Status: DC

## 2024-05-29 MED ORDER — PANTOPRAZOLE SODIUM 40 MG PO TBEC: 40.0000 mg | DELAYED_RELEASE_TABLET | Freq: Two times a day (BID) | ORAL | 0 refills | Status: AC

## 2024-05-29 NOTE — Progress Notes (Signed)
 Hi Kristin Kelly,  Can you please schedule a follow up appointment for this patient in 608 weeks with ME ? =====================  I spoke to the patient over the phone as well  I reviewed the pathology results. Kristin Kelly, can you send her a letter with the findings as described below please?  Thanks,  Kristin Kelly Kristin Kelly Kristin Kelly Kristin Kelly Toback, MD Gastroenterology and Hepatology [facility]  ---------------------------------------------------------------------------------------------  [facility] [address], [address], [place], [place] [address] Phone:  [phone]     05/29/24 [place], [place]                                                                          Dear Kristin Kelly,   I am writing to inform you that the biopsies taken during your recent endoscopic examination showed:   A. SMALL BOWEL, BIOPSY:  Benign duodenal mucosa with no diagnostic abnormality   B. 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 ASCENDING COLON, POLYPECTOMY:  Tubular adenoma  Negative for high-grade dysplasia and carcinoma   D. COLON, RANDOM, BIOPSY:  Benign colonic mucosa with no diagnostic abnormalities     What does this mean?  I am writing to let you know the results of your recent colonoscopy.  You had a total of 1 polyp removed. The pathology came back as "tubular adenoma." These findings are NOT cancer, but had the polyps remained in your colon, they could have turned into cancer.   I am writing to let you know the results of your recent endoscopy (EGD).  You were found to have an infection called H. pylori, which is a bacteria that lives in the stomach. We will send you a pack of medications to take for 10 days and an acid blocking medication Pantoprazole  twice daily as well. Take them all twice a day. It is VERY IMPORTANT that you  take all of the medications as directed. If the infection is not fully treated, in can increase your risk of stomach cancer.  This bacteria also can explain the iron deficiency we found in your labwork   3 capsules (bismuth  subcitrate potassium 140 mg/metronidazole  125 mg/tetracycline hydrochloride 125 mg per capsule) orally 4 times daily for 10 days, in combination with pantoprazole  40 mg orally twice daily   Also I value your feedback , so if you get a survey , please take the time to fill it out and thank you for choosing [facility]  Please call us  at [phone] if you have persistent problems or have questions about your condition that have not been fully answered at this time.   Sincerely,   Jailah Willis Kristin Kelly Gaspare Netzel, MD Gastroenterology and Hepatology

## 2024-05-29 NOTE — Progress Notes (Signed)
procedure note and pathology result faxed to PCP

## 2024-05-31 ENCOUNTER — Telehealth (INDEPENDENT_AMBULATORY_CARE_PROVIDER_SITE_OTHER): Payer: Self-pay | Admitting: Gastroenterology

## 2024-05-31 DIAGNOSIS — K2970 Gastritis, unspecified, without bleeding: Secondary | ICD-10-CM

## 2024-05-31 DIAGNOSIS — A048 Other specified bacterial intestinal infections: Secondary | ICD-10-CM

## 2024-05-31 MED ORDER — DOXYCYCLINE MONOHYDRATE 100 MG PO TABS: 100.0000 mg | ORAL_TABLET | Freq: Two times a day (BID) | ORAL | 0 refills | Status: AC

## 2024-05-31 MED ORDER — BISMUTH 262 MG PO CHEW: 2.0000 | CHEWABLE_TABLET | Freq: Four times a day (QID) | ORAL | 0 refills | Status: AC

## 2024-05-31 MED ORDER — METRONIDAZOLE 500 MG PO TABS: 500.0000 mg | ORAL_TABLET | Freq: Three times a day (TID) | ORAL | 0 refills | Status: AC

## 2024-05-31 MED ORDER — PANTOPRAZOLE SODIUM 40 MG PO TBEC: 40.0000 mg | DELAYED_RELEASE_TABLET | Freq: Two times a day (BID) | ORAL | 0 refills | Status: DC

## 2024-05-31 NOTE — Telephone Encounter (Signed)
 Pt contacted and advised that provider has sent in other antibiotics. Informed pt that when she goes to pharmacy she will pick up 4 medications.  Patient verbalized understanding

## 2024-05-31 NOTE — Telephone Encounter (Signed)
 Hi Tanya  Can you please inform patient I have ordered metronidazole , bismuth  , doxycycline  and pantoprazole  as insurance did not cover Pylera

## 2024-05-31 NOTE — Telephone Encounter (Signed)
 Pt left voicemail stating that insurance will not cover antibiotic that was called in. Pylera was sent in on 05/29/24. Pt is requesting another antibiotic to be sent in. Please advise. Thank you

## 2024-05-31 NOTE — Addendum Note (Signed)
 Addended by: CINDERELLA DEATRICE SMILES on: 05/31/2024 08:25 AM   Modules accepted: Orders

## 2024-06-05 ENCOUNTER — Telehealth (INDEPENDENT_AMBULATORY_CARE_PROVIDER_SITE_OTHER): Payer: Self-pay | Admitting: Gastroenterology

## 2024-06-05 NOTE — Telephone Encounter (Signed)
 Patient is currently being treated for H pylori. I suspect the black stools are related to bismuth  as this can turn stool black, but I can't rule out the possibility of bleeding causing black stool considering she is dizzy though dizziness can be a side effect of her antibiotics. I recommend we update labs to ensure her hemoglobin and electrolytes and kidney function are stable. Please arrange CBC, BMP.   Regarding diarrhea, appears this has been an ongoing issue for her. It may be worsening in the setting of antibiotics. What is she taking for diarrhea at this time?

## 2024-06-05 NOTE — Telephone Encounter (Signed)
 Noted

## 2024-06-05 NOTE — Telephone Encounter (Signed)
 Pt contacted. Pt advised of provider recommendations. After explaining what provider has recommend pt asked me if I was saying this on my own or is was this from the provider. I explained this was from [facility] whom is a Agricultural consultant for Dr.Ahmed while he is out on vacation. Pt then stated she will not have blood work done and will not do what a Advice worker says. Advised pt that Josette can see her chart and what Dr.Ahmed has written. Pt states she called Dr.Ahmed and messaged him but he did not return call or message back and she is very disappointed. Pt wanted to know if Dr.Ahmed was in the [place]  or out of the country. I advised pt I was not aware of where Dr.Ahmed was. Pt states her children are doctors and her son told her not to take any antibiotics.

## 2024-06-05 NOTE — Telephone Encounter (Signed)
 Pt left voicemail that she is taking antibiotics and this is her 4th day. She is having black stool and feeling dizzy. Returned call to pt. Pt states that each day she is getting worse. States she is going to the bathroom to much. Pt states she sent the provider (Dr.Ahmed) a message. Pt states she went to the bank and she felt dizzy and the bank manager followed her home.  I asked her was the message on my chart and she said no on his personal phone. I advised pt that provider was currently on vacation. Pt stated that provider said she would experience these symptoms so she wanted us  to let him know.  Please advise. Thank you!

## 2024-06-05 NOTE — Telephone Encounter (Signed)
 Noted. I am not sure where Dr. Cinderella is on vacation. I have reviewed her chart and these are my recommendations based on the information I have. If she prefers not to follow our instructions that is her decision. If she starts to feel worse in regards to dizziness, lightheadedness, or likely she will pass out, she should go to the emergency room.

## 2024-06-11 NOTE — Telephone Encounter (Signed)
 Hi Reba  Thank you to the staff for handling this . I was able to get in touch with her last week .

## 2024-07-11 ENCOUNTER — Encounter (INDEPENDENT_AMBULATORY_CARE_PROVIDER_SITE_OTHER): Payer: Self-pay | Admitting: Gastroenterology

## 2024-07-11 ENCOUNTER — Ambulatory Visit (INDEPENDENT_AMBULATORY_CARE_PROVIDER_SITE_OTHER): Admitting: Gastroenterology

## 2024-07-11 VITALS — BP 112/72 | HR 62 | Temp 98.0°F | Ht 64.0 in | Wt 179.2 lb

## 2024-07-11 DIAGNOSIS — D509 Iron deficiency anemia, unspecified: Secondary | ICD-10-CM | POA: Diagnosis not present

## 2024-07-11 DIAGNOSIS — R7982 Elevated C-reactive protein (CRP): Secondary | ICD-10-CM

## 2024-07-11 DIAGNOSIS — K2970 Gastritis, unspecified, without bleeding: Secondary | ICD-10-CM

## 2024-07-11 DIAGNOSIS — K529 Noninfective gastroenteritis and colitis, unspecified: Secondary | ICD-10-CM

## 2024-07-11 DIAGNOSIS — A048 Other specified bacterial intestinal infections: Secondary | ICD-10-CM

## 2024-07-11 DIAGNOSIS — D500 Iron deficiency anemia secondary to blood loss (chronic): Secondary | ICD-10-CM

## 2024-07-11 NOTE — Patient Instructions (Signed)
 It was very nice to meet you today, as dicussed with will plan for the following :  1) Ensure adequate fluid intake: Aim for 8 glasses of water daily. Follow a high fiber diet: Include foods such as dates, prunes, pears, and kiwi. Use Metamucil one to two times daily Miralax 1-2 times daily whenever you are constiapted

## 2024-07-11 NOTE — Progress Notes (Signed)
 Kristin Kelly , M.D. Gastroenterology & Hepatology [facility] [address] [place], [place] [address] Primary Care Physician: Orpha Yancey LABOR, MD [address] [place] [address]  Chief Complaint: Chronic diarrhea and Iron deficiency  History of Present Illness: Kristin Kelly is a 78 y.o. female with hypertension who presents for evaluation of chronic diarrhea and iron deficiency.  Patient was last seen 05/2024 where she had underwent upper endoscopy colonoscopy found to have H. pylori gastritis treated with bismuth  based quadruple therapy.  Colonoscopy with ablation of AVM and removal of subcentimeter tubular adenoma Patient reports since being treated for H. pylori her symptoms have significantly improved.  Denies any further bloating or diarrhea.  Patient had a rough time taking antibiotics as she had significant diarrhea and discomfort which has resolved now.  Patient also reports her complaint of joint pain has significantly also improved after H. pylori treatment  Patient is having 2-3 bowel movements daily but sometimes will go 2 to 3 days without having a bowel.  Previously  reported that for many years she has liquid stool 5-6 times a day.  Mostly postprandial 30 minutes to an hour after food intake.  Most recent labs from 02/2024 hemoglobin 12.5 platelet 290 creatinine 1.26 GFR 44  Normal liver enzymes  Iron saturation 13 TIBC 440 ferritin 24 Normal Vitamin B12 and folate  Hemoglobin A1c 6.1  last EGD:05/2024  - Normal esophagus. - Erythematous mucosa in the antrum. Biopsied. - Normal duodenal bulb and second portion of the duodenum. Biopsied.  Last Colonoscopy:05/2024  - A single non- bleeding colonic angioectasia. Treated with argon plasma coagulation ( APC) . - One 4 mm polyp in the ascending colon, removed with a cold snare. Resected and retrieved. - Non- bleeding internal hemorrhoids.  A. SMALL BOWEL, BIOPSY:   Benign duodenal mucosa with no diagnostic abnormality   B. 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 ASCENDING COLON, POLYPECTOMY:  Tubular adenoma  Negative for high-grade dysplasia and carcinoma   D. COLON, RANDOM, BIOPSY:  Benign colonic mucosa with no diagnostic abnormalities   FHx: neg for any gastrointestinal/liver disease, no malignancies Social: neg smoking, alcohol or illicit drug use  Past Medical History: Past Medical History:  Diagnosis Date   Hypertension    Sleep apnea     Past Surgical History: Past Surgical History:  Procedure Laterality Date   CHOLECYSTECTOMY     COLONOSCOPY N/A 05/25/2024   Procedure: COLONOSCOPY;  Surgeon: Cinderella Deatrice FALCON, MD;  Location: [facility];  Service: Endoscopy;  Laterality: N/A;  11:00 am, asa 1/2   ESOPHAGOGASTRODUODENOSCOPY N/A 05/25/2024   Procedure: EGD (ESOPHAGOGASTRODUODENOSCOPY);  Surgeon: Cinderella Deatrice FALCON, MD;  Location: [facility];  Service: Endoscopy;  Laterality: N/A;    Family History:History reviewed. No pertinent family history.  Social History: Social History   Tobacco Use  Smoking Status Never  Smokeless Tobacco Never   Social History   Substance and Sexual Activity  Alcohol Use Never   Social History   Substance and Sexual Activity  Drug Use Never    Allergies: Allergies  Allergen Reactions   Peanut Oil Dermatitis    Medications: Current Outpatient Medications  Medication Sig Dispense Refill   acetaminophen  (TYLENOL ) 650 MG CR tablet Take 650 mg by mouth.     Ascorbic Acid (VITA-C PO) Take by mouth.     aspirin  EC 81  MG tablet Take 1 tablet (81 mg total) by mouth daily. 30 tablet 3   CALCIUM PO Take by mouth.     cholecalciferol (VITAMIN D) 1000 UNITS tablet Take 1,000 Units by mouth daily.     metoprolol succinate (TOPROL-XL) 25 MG 24 hr tablet Take 25 mg by  mouth daily.     Multiple Vitamins-Minerals (MULTIVITAMINS THER. W/MINERALS) TABS tablet Take 1 tablet by mouth daily.     pregabalin (LYRICA) 75 MG capsule Take 1 capsule twice a day by oral route. (Patient taking differently: 75 mg as needed.)     Vitamin D, Ergocalciferol, (DRISDOL) 1.25 MG (50000 UNIT) CAPS capsule Take 1,250 mcg by mouth.     B Complex Vitamins (VITAMIN B-COMPLEX) TABS Take 1 tablet by mouth. (Patient not taking: Reported on 07/11/2024)     cholestyramine  (QUESTRAN ) 4 g packet Take 1 packet (4 g total) by mouth daily. Mix with 4-6 oz liquid.  Take other meds 1 hr before or 4-6 hr after cholestyramine . (Patient not taking: Reported on 07/11/2024) 30 packet 1   ferrous gluconate  (FERGON) 324 MG tablet Take 1 tablet (324 mg total) by mouth every other day. (Patient not taking: Reported on 07/11/2024) 60 tablet    HYDROcodone -acetaminophen  (NORCO) 5-325 MG per tablet Take 1-2 tablets by mouth every 4 (four) hours as needed. (Patient not taking: Reported on 07/11/2024) 10 tablet 0   pantoprazole  (PROTONIX ) 40 MG tablet Take 1 tablet (40 mg total) by mouth 2 (two) times daily before a meal for 14 days. (Patient not taking: Reported on 07/11/2024) 28 tablet 0   pantoprazole  (PROTONIX ) 40 MG tablet Take 1 tablet (40 mg total) by mouth 2 (two) times daily for 14 days. (Patient not taking: Reported on 07/11/2024) 28 tablet 0   psyllium (METAMUCIL) 58.6 % packet Take 1 packet by mouth 2 (two) times daily. (Patient not taking: Reported on 07/11/2024) 60 packet 2   No current facility-administered medications for this visit.    Review of Systems: GENERAL: negative for malaise, night sweats HEENT: No changes in hearing or vision, no nose bleeds or other nasal problems. NECK: Negative for lumps, goiter, pain and significant neck swelling RESPIRATORY: Negative for cough, wheezing CARDIOVASCULAR: Negative for chest pain, leg swelling, palpitations, orthopnea GI: SEE HPI MUSCULOSKELETAL: Negative  for joint pain or swelling, back pain, and muscle pain. SKIN: Negative for lesions, rash HEMATOLOGY Negative for prolonged bleeding, bruising easily, and swollen nodes. ENDOCRINE: Negative for cold or heat intolerance, polyuria, polydipsia and goiter. NEURO: negative for tremor, gait imbalance, syncope and seizures. The remainder of the review of systems is noncontributory.   Physical Exam: BP 112/72   Pulse 62   Temp 98 F (36.7 C)   Ht 5' 4 (1.626 m)   Wt 179 lb 3.2 oz (81.3 kg)   BMI 30.76 kg/m   GENERAL: The patient is AO x3, in no acute distress. HEENT: Head is normocephalic and atraumatic. EOMI are intact. Mouth is well hydrated and without lesions. NECK: Supple. No masses LUNGS: Clear to auscultation. No presence of rhonchi/wheezing/rales. Adequate chest expansion HEART: RRR, normal s1 and s2. ABDOMEN: Soft, nontender, no guarding, no peritoneal signs, and nondistended. BS +. No masses.  Imaging/Labs: as above  I personally reviewed and interpreted the available labs, imaging and endoscopic files.  Ultrasound 2022  Impression  1. Common bile duct dilatation is likely due to previous cholecystectomy. 2. Probable mild hepatic steatosis. 3. No other abnormalities.  Labs;  CRP : 1 GI PCR :  Negative for any infection  Cdiff : negative Fecal Calprotectin : 32 Normal IgA and negative Celiac panel   Impression and Plan:  Kristin Kelly is a 78 y.o. female with hypertension who presents for evaluation of chronic diarrhea and iron deficiency.  #Chronic diarrhea #Iron deficiency / H pylori gastritis   Patient was last seen 05/2024 where she had underwent upper endoscopy colonoscopy found to have H. pylori gastritis treated with bismuth  based quadruple therapy.  Colonoscopy with ablation of AVM and removal of subcentimeter tubular adenoma   Symptoms have significantly improved, occasional altered bowel movements with skipping days of defecation likely intermittent  constipation  Iron deficiency could be attributed due to H. pylori which is now treated and angiectasia seen in the colon status post ablation  I recommend patient to continue Metamucil and MiraLAX  Patient has history of cholecystectomy and that can be a component of bile acid diarrhea, if above does not explain patient diarrhea may give trial of cholestyramine  in future  #Elevated creatinine   From recent labs it appears patient continues to have gradual rising and creatinine 1.26 GFR 44 Patient does have hypertension although appears to be controlled; I advised patient to follow-up with primary care physician and possibly nephrologist  All questions were answered.      Kristin Iyer Faizan Luqman Perrelli, MD Gastroenterology and Hepatology [facility]   This chart has been completed using [facility] Dictation software, and while attempts have been made to ensure accuracy , certain words and phrases may not be transcribed as intended

## 2024-07-17 ENCOUNTER — Telehealth (INDEPENDENT_AMBULATORY_CARE_PROVIDER_SITE_OTHER): Payer: Self-pay | Admitting: *Deleted

## 2024-07-17 NOTE — Telephone Encounter (Signed)
 I need clarification on referral to Dr Bluford and Dx, - your note mentions hypertension and you advised herto follow up with PCP - her PCP is Dr Orpha

## 2024-07-23 NOTE — Telephone Encounter (Signed)
 Per Dr Cinderella, patient is wanting to change PCP - Referral placed in epic for Dr Bluford ([facility]), they will contact patient with apt

## 2024-08-18 ENCOUNTER — Encounter (INDEPENDENT_AMBULATORY_CARE_PROVIDER_SITE_OTHER): Payer: Self-pay | Admitting: Gastroenterology

## 2024-08-18 DIAGNOSIS — A048 Other specified bacterial intestinal infections: Secondary | ICD-10-CM

## 2024-08-18 DIAGNOSIS — K2970 Gastritis, unspecified, without bleeding: Secondary | ICD-10-CM

## 2024-08-18 MED ORDER — PANTOPRAZOLE SODIUM 40 MG PO TBEC: 40.0000 mg | DELAYED_RELEASE_TABLET | Freq: Every day | ORAL | 0 refills | Status: AC
